# Patient Record
Sex: Female | Born: 1980
Health system: Southern US, Community
[De-identification: ages and names within clinical notes are randomized; demographics above are authoritative.]

## PROBLEM LIST (undated history)

## (undated) ENCOUNTER — Inpatient Hospital Stay (HOSPITAL_COMMUNITY): Payer: 59

## (undated) DIAGNOSIS — Z8619 Personal history of other infectious and parasitic diseases: Secondary | ICD-10-CM

## (undated) DIAGNOSIS — D351 Benign neoplasm of parathyroid gland: Secondary | ICD-10-CM

## (undated) DIAGNOSIS — T8859XA Other complications of anesthesia, initial encounter: Secondary | ICD-10-CM

## (undated) DIAGNOSIS — D219 Benign neoplasm of connective and other soft tissue, unspecified: Secondary | ICD-10-CM

## (undated) DIAGNOSIS — O24419 Gestational diabetes mellitus in pregnancy, unspecified control: Secondary | ICD-10-CM

## (undated) DIAGNOSIS — K219 Gastro-esophageal reflux disease without esophagitis: Secondary | ICD-10-CM

## (undated) DIAGNOSIS — T4145XA Adverse effect of unspecified anesthetic, initial encounter: Secondary | ICD-10-CM

## (undated) HISTORY — DX: Benign neoplasm of connective and other soft tissue, unspecified: D21.9

## (undated) HISTORY — DX: Gastro-esophageal reflux disease without esophagitis: K21.9

## (undated) HISTORY — DX: Benign neoplasm of parathyroid gland: D35.1

## (undated) HISTORY — DX: Personal history of other infectious and parasitic diseases: Z86.19

## (undated) HISTORY — PX: DILATION AND CURETTAGE OF UTERUS: SHX78

## (undated) HISTORY — PX: WISDOM TOOTH EXTRACTION: SHX21

---

## 2008-09-02 HISTORY — PX: DILATION AND CURETTAGE OF UTERUS: SHX78

## 2008-09-09 ENCOUNTER — Encounter (HOSPITAL_COMMUNITY): Payer: Self-pay | Admitting: Obstetrics and Gynecology

## 2008-09-09 ENCOUNTER — Ambulatory Visit (HOSPITAL_COMMUNITY): Admission: RE | Admit: 2008-09-09 | Discharge: 2008-09-09 | Payer: Self-pay | Admitting: Obstetrics and Gynecology

## 2010-01-24 ENCOUNTER — Encounter: Admission: RE | Admit: 2010-01-24 | Discharge: 2010-01-24 | Payer: Self-pay | Admitting: Obstetrics and Gynecology

## 2010-09-13 LAB — CBC
HCT: 41.3 % (ref 36.0–46.0)
MCHC: 34 g/dL (ref 30.0–36.0)
MCV: 95 fL (ref 78.0–100.0)
Platelets: 198 10*3/uL (ref 150–400)
RBC: 4.35 MIL/uL (ref 3.87–5.11)
RDW: 12.5 % (ref 11.5–15.5)

## 2010-09-13 LAB — ABO/RH: ABO/RH(D): B POS

## 2010-09-26 LAB — GC/CHLAMYDIA PROBE AMP, GENITAL
Chlamydia: NEGATIVE
Gonorrhea: NEGATIVE

## 2010-09-26 LAB — RPR: RPR: NONREACTIVE

## 2010-09-26 LAB — HIV ANTIBODY (ROUTINE TESTING W REFLEX): HIV: NONREACTIVE

## 2010-10-02 DIAGNOSIS — O479 False labor, unspecified: Secondary | ICD-10-CM

## 2010-10-17 ENCOUNTER — Other Ambulatory Visit: Payer: Self-pay | Admitting: Neurosurgery

## 2010-10-17 DIAGNOSIS — D497 Neoplasm of unspecified behavior of endocrine glands and other parts of nervous system: Secondary | ICD-10-CM

## 2010-10-17 NOTE — Op Note (Signed)
NAMETENELLE, ANDREASON                ACCOUNT NO.:  [id number]   MEDICAL RECORD NO.:  [id number]          PATIENT TYPE:  AMB   LOCATION:  SDC                           FACILITY:  [facility]   PHYSICIAN:  Zelphia Cairo, MD    DATE OF BIRTH:  1980-12-21   DATE OF PROCEDURE:  09/09/2008  DATE OF DISCHARGE:                               OPERATIVE REPORT   PREOPERATIVE DIAGNOSIS:  Anembryonic  pregnancy.   POSTOPERATIVE DIAGNOSIS:  Anembryonic  pregnancy.   PROCEDURES:  1. Cervical block.  2. D and E.   SURGEON:  Zelphia Cairo, MD   ANESTHESIA:  MAC with local.   SPECIMEN:  Products of conception to Pathology.   ESTIMATED BLOOD LOSS:  Minimal.   COMPLICATIONS:  None.   CONDITION:  Stable to recovery room.   PROCEDURE:  Aleesa was taken to the operating room where anesthesia was  found to be adequate.  She was prepped and draped in sterile fashion and  a catheter was used to drain her bladder for approximately 100 mL of  clear urine.  Bivalve speculum was placed in the uterus and a single-  tooth tenaculum on the anterior lip of the cervix.  The cervix was  serially dilated using Pratt dilators.  A 7-French suction catheter was  inserted into the uterine cavity and products of conception were  removed.  When no further tissue was removed, a gentle curetting was  then performed to ensure an uterine cry throughout the uterine cavity.  Suction curette was then inserted one last time to remove any debris.  Single-tooth tenaculum was removed from the cervix.  Cervix was found to  be hemostatic.  Speculum was removed.  The patient was taken to the  recovery room in stable condition.      Zelphia Cairo, MD  Electronically Signed     GA/MEDQ  D:  09/09/2008  T:  09/09/2008  Job:  [id number]

## 2010-11-14 ENCOUNTER — Other Ambulatory Visit: Payer: Self-pay

## 2010-11-29 ENCOUNTER — Ambulatory Visit
Admission: RE | Admit: 2010-11-29 | Discharge: 2010-11-29 | Disposition: A | Discharge status: Home / Self Care | Payer: 59 | Source: Ambulatory Visit | Attending: Neurosurgery | Admitting: Neurosurgery

## 2010-11-29 DIAGNOSIS — D497 Neoplasm of unspecified behavior of endocrine glands and other parts of nervous system: Secondary | ICD-10-CM

## 2011-02-28 ENCOUNTER — Encounter: Payer: 59 | Attending: Obstetrics and Gynecology | Admitting: Dietician

## 2011-02-28 ENCOUNTER — Encounter: Payer: Self-pay | Admitting: Dietician

## 2011-02-28 DIAGNOSIS — Z713 Dietary counseling and surveillance: Secondary | ICD-10-CM | POA: Insufficient documentation

## 2011-02-28 DIAGNOSIS — O99810 Abnormal glucose complicating pregnancy: Secondary | ICD-10-CM | POA: Insufficient documentation

## 2011-02-28 NOTE — Progress Notes (Signed)
  Patient was seen on 02/28/2011 for Gestational Diabetes self-management class at the [facility]. The following learning objectives were met by the patient during this course:   States the definition of Gestational Diabetes  States why dietary management is important in controlling blood glucose  Describes the effects each nutrient has on blood glucose levels  Demonstrates ability to create a balanced meal plan  Demonstrates carbohydrate counting   States when to check blood glucose levels  Demonstrates proper blood glucose monitoring techniques  States the effect of stress and exercise on blood glucose levels  States the importance of limiting caffeine and abstaining from alcohol and smoking  Blood glucose monitor given: Accu-Chek Nano  Lot # [id number] Exp: 06/03/2012 Blood glucose reading: 128 Had a late lunch with a bag of chips.  Patient instructed to monitor glucose levels: FBS: 60 - <90 1 hour: <140 2 hour: <120  *Patient received handouts:  Nutrition Diabetes and Pregnancy  Carbohydrate Counting List  Patient will be seen for follow-up as needed.

## 2011-03-23 ENCOUNTER — Inpatient Hospital Stay (HOSPITAL_COMMUNITY)
Admission: AD | Admit: 2011-03-23 | Discharge: 2011-03-23 | Disposition: A | Discharge status: Home / Self Care | Payer: 59 | Source: Ambulatory Visit | Attending: Obstetrics and Gynecology | Admitting: Obstetrics and Gynecology

## 2011-03-23 ENCOUNTER — Encounter (HOSPITAL_COMMUNITY): Payer: Self-pay | Admitting: *Deleted

## 2011-03-23 DIAGNOSIS — O99891 Other specified diseases and conditions complicating pregnancy: Secondary | ICD-10-CM | POA: Insufficient documentation

## 2011-03-23 DIAGNOSIS — O479 False labor, unspecified: Secondary | ICD-10-CM

## 2011-03-23 HISTORY — DX: Gestational diabetes mellitus in pregnancy, unspecified control: O24.419

## 2011-03-23 NOTE — ED Notes (Signed)
Discussed use of heating pad, option of microwave pad, only stays warm 15-72min. No threat of injury if she falls asleep on it.

## 2011-03-23 NOTE — ED Provider Notes (Signed)
Nicole Sensor Moore30 y.o.G3P0020 @[redacted]w[redacted]d   SUBJECTIVE  HPI:  She presents with the concern that she woke up to find that her heating pad was on her abdomen. She went to sleep with a heating pad on her back to the low back discomfort. She called the office and was told to come in to make sure the baby is okay. She is unaware of contractions. She has no abdominal pain. No dysuria. No leakage of fluid or vaginal bleeding. The fetus is very active.  Pregnancy is complicated by A1 gestational diabetes and iron deficiency anemia. Her last prenatal visit was yesterday.  Past Medical History  Diagnosis Date  . Gestational diabetes    Past Surgical History  Procedure Date  . Dilation and curettage of uterus    History   Social History  . Marital Status: Married    Spouse Name: N/A    Number of Children: N/A  . Years of Education: N/A   Occupational History  . Not on file.   Social History Main Topics  . Smoking status: Never Smoker   . Smokeless tobacco: Never Used  . Alcohol Use: No  . Drug Use: No  . Sexually Active: Not Currently   Other Topics Concern  . Not on file   Social History Narrative  . No narrative on file   No current facility-administered medications on file prior to encounter.   No current outpatient prescriptions on file prior to encounter.   No Known Allergies  ROS: Pertinent items in HPI  OBJECTIVE  BP 109/65  Pulse 87  Temp(Src) 98.7 F (37.1 C) (Oral)  Resp 20  Ht 5\' 4"  (1.626 m)  Wt 80.91 kg (178 lb 6 oz)  BMI 30.62 kg/m2   Physical Exam:  General: WN/WD in NAD Abd: Soft nontender; consistent with 34 week size. Mildly palpable occasional contraction. VE: cervix posterior, sof,t fingertip/ long/ -2 vertex Back: negative CVAT EAV:WUJWJ dependent edema  Toco: Irregular mild uterine contractions Fetal heart rate: 130 baseline, reactive, moderate variability   ASSESSMENT  Fetal well-being by EFM and good FM   PLAN Reassured pt and notified  Dr. Rana Snare. Continue routine [facility]

## 2011-03-23 NOTE — Progress Notes (Signed)
Patient is here because she wants to make sure that her baby is fine. She states that she was back pain last night and placed a heating pad on a body pillow. Sometime during the night she realized that she was laying on the heating pad. She states that she think that the heating pad was on her abdomen for an hour. She reports good fetal movement. Denies any contractions, vaginal bleeding, lof or discharge. She states that she couldn't go back to sleep and  Her doctor's triage nurse asked to come in.

## 2011-03-23 NOTE — Progress Notes (Signed)
SAYS LAST NIGHT WHEN WENT TO BED  HAD  HEATING PAD ON BACK- SOMETIME DURING NIGHT  SHE TURNED OVER - WITH HEATING PAD ON ABD  X1 HR.   SHE DOES FEEL BABY MOVE. FHR- 166- IN TRIAGE.       CALLED ON CALL NURSE  - TOLD TO COME IN.

## 2011-03-23 NOTE — Progress Notes (Signed)
SAYS SHE WAS LYING ON R SIDE WITH HEATING PAD ON BODY PILLOW.

## 2011-04-25 ENCOUNTER — Encounter (HOSPITAL_COMMUNITY): Payer: Self-pay | Admitting: *Deleted

## 2011-04-25 ENCOUNTER — Telehealth (HOSPITAL_COMMUNITY): Payer: Self-pay | Admitting: *Deleted

## 2011-04-25 NOTE — Telephone Encounter (Signed)
Preadmission screen  

## 2011-05-01 ENCOUNTER — Encounter (HOSPITAL_COMMUNITY): Payer: Self-pay

## 2011-05-01 ENCOUNTER — Encounter (HOSPITAL_COMMUNITY): Payer: Self-pay | Admitting: Anesthesiology

## 2011-05-01 ENCOUNTER — Inpatient Hospital Stay (HOSPITAL_COMMUNITY)
Admission: RE | Admit: 2011-05-01 | Discharge: 2011-05-03 | DRG: 766 | Disposition: A | Discharge status: Home / Self Care | Payer: 59 | Source: Ambulatory Visit | Attending: Obstetrics and Gynecology | Admitting: Obstetrics and Gynecology

## 2011-05-01 ENCOUNTER — Inpatient Hospital Stay (HOSPITAL_COMMUNITY): Payer: 59 | Admitting: Anesthesiology

## 2011-05-01 ENCOUNTER — Encounter (HOSPITAL_COMMUNITY)
Admission: RE | Disposition: A | Discharge status: Home / Self Care | Payer: Self-pay | Source: Ambulatory Visit | Attending: Obstetrics and Gynecology

## 2011-05-01 ENCOUNTER — Other Ambulatory Visit: Payer: Self-pay | Admitting: Obstetrics and Gynecology

## 2011-05-01 DIAGNOSIS — O339 Maternal care for disproportion, unspecified: Secondary | ICD-10-CM | POA: Diagnosis present

## 2011-05-01 DIAGNOSIS — O3660X0 Maternal care for excessive fetal growth, unspecified trimester, not applicable or unspecified: Principal | ICD-10-CM | POA: Diagnosis present

## 2011-05-01 DIAGNOSIS — O99814 Abnormal glucose complicating childbirth: Secondary | ICD-10-CM | POA: Diagnosis present

## 2011-05-01 DIAGNOSIS — O324XX0 Maternal care for high head at term, not applicable or unspecified: Secondary | ICD-10-CM | POA: Diagnosis present

## 2011-05-01 LAB — CBC
Hemoglobin: 12.2 g/dL (ref 12.0–15.0)
MCHC: 32.5 g/dL (ref 30.0–36.0)
Platelets: 194 10*3/uL (ref 150–400)
RBC: 4.22 MIL/uL (ref 3.87–5.11)

## 2011-05-01 LAB — GLUCOSE, CAPILLARY: Glucose-Capillary: 86 mg/dL (ref 70–99)

## 2011-05-01 LAB — RPR: RPR Ser Ql: NONREACTIVE

## 2011-05-01 SURGERY — Surgical Case
Anesthesia: Regional | Site: Abdomen | Wound class: Clean Contaminated

## 2011-05-01 MED ORDER — ZOLPIDEM TARTRATE 10 MG PO TABS: 10.0000 mg | ORAL_TABLET | Freq: Every evening | ORAL | Status: DC | PRN

## 2011-05-01 MED ORDER — OXYTOCIN 20 UNITS IN LACTATED RINGERS INFUSION - SIMPLE: 125.0000 mL/h | INTRAVENOUS | Status: DC

## 2011-05-01 MED ORDER — CEFAZOLIN SODIUM 1-5 GM-% IV SOLN
1.0000 g | Freq: Once | INTRAVENOUS | Status: AC
  Administered 2011-05-01: 1 g via INTRAVENOUS
  Filled 2011-05-01: qty 50

## 2011-05-01 MED ORDER — FLEET ENEMA 7-19 GM/118ML RE ENEM: 1.0000 | ENEMA | RECTAL | Status: DC | PRN

## 2011-05-01 MED ORDER — KETOROLAC TROMETHAMINE 30 MG/ML IJ SOLN: 30.0000 mg | Freq: Four times a day (QID) | INTRAMUSCULAR | Status: AC | PRN

## 2011-05-01 MED ORDER — SCOPOLAMINE 1 MG/3DAYS TD PT72
MEDICATED_PATCH | TRANSDERMAL | Status: AC
  Administered 2011-05-01: 1.5 mg via TRANSDERMAL
  Filled 2011-05-01: qty 1

## 2011-05-01 MED ORDER — SODIUM CHLORIDE 0.9 % IV SOLN: 1.0000 ug/kg/h | INTRAVENOUS | Status: DC | PRN

## 2011-05-01 MED ORDER — IBUPROFEN 600 MG PO TABS
600.0000 mg | ORAL_TABLET | Freq: Four times a day (QID) | ORAL | Status: DC
  Administered 2011-05-02 – 2011-05-03 (×6): 600 mg via ORAL
  Filled 2011-05-01 (×6): qty 1

## 2011-05-01 MED ORDER — SODIUM CHLORIDE 0.9 % IR SOLN
Status: DC | PRN
  Administered 2011-05-01: 1000 mL

## 2011-05-01 MED ORDER — ONDANSETRON HCL 4 MG/2ML IJ SOLN: 4.0000 mg | Freq: Three times a day (TID) | INTRAMUSCULAR | Status: DC | PRN

## 2011-05-01 MED ORDER — OXYTOCIN 20 UNITS IN LACTATED RINGERS INFUSION - SIMPLE
INTRAVENOUS | Status: DC | PRN
  Administered 2011-05-01 (×2): 20 [IU] via INTRAVENOUS

## 2011-05-01 MED ORDER — SODIUM CHLORIDE 0.9 % IJ SOLN: 3.0000 mL | INTRAMUSCULAR | Status: DC | PRN

## 2011-05-01 MED ORDER — SIMETHICONE 80 MG PO CHEW: 80.0000 mg | CHEWABLE_TABLET | ORAL | Status: DC | PRN

## 2011-05-01 MED ORDER — ONDANSETRON HCL 4 MG/2ML IJ SOLN
INTRAMUSCULAR | Status: DC | PRN
  Administered 2011-05-01: 4 mg via INTRAVENOUS

## 2011-05-01 MED ORDER — FENTANYL 2.5 MCG/ML BUPIVACAINE 1/10 % EPIDURAL INFUSION (WH - ANES)
14.0000 mL/h | INTRAMUSCULAR | Status: DC
  Filled 2011-05-01: qty 60

## 2011-05-01 MED ORDER — IBUPROFEN 600 MG PO TABS: 600.0000 mg | ORAL_TABLET | Freq: Four times a day (QID) | ORAL | Status: DC | PRN

## 2011-05-01 MED ORDER — HYDROMORPHONE HCL PF 1 MG/ML IJ SOLN: 0.2500 mg | INTRAMUSCULAR | Status: DC | PRN

## 2011-05-01 MED ORDER — LIDOCAINE-EPINEPHRINE (PF) 2 %-1:200000 IJ SOLN
INTRAMUSCULAR | Status: AC
  Filled 2011-05-01: qty 20

## 2011-05-01 MED ORDER — EPHEDRINE SULFATE 50 MG/ML IJ SOLN
INTRAMUSCULAR | Status: AC
  Filled 2011-05-01: qty 1

## 2011-05-01 MED ORDER — SENNOSIDES-DOCUSATE SODIUM 8.6-50 MG PO TABS
2.0000 | ORAL_TABLET | Freq: Every day | ORAL | Status: DC
  Administered 2011-05-02 (×2): 2 via ORAL

## 2011-05-01 MED ORDER — TETANUS-DIPHTH-ACELL PERTUSSIS 5-2.5-18.5 LF-MCG/0.5 IM SUSP: 0.5000 mL | Freq: Once | INTRAMUSCULAR | Status: DC

## 2011-05-01 MED ORDER — SIMETHICONE 80 MG PO CHEW
80.0000 mg | CHEWABLE_TABLET | Freq: Three times a day (TID) | ORAL | Status: DC
  Administered 2011-05-02 – 2011-05-03 (×7): 80 mg via ORAL

## 2011-05-01 MED ORDER — KETOROLAC TROMETHAMINE 60 MG/2ML IM SOLN
60.0000 mg | Freq: Once | INTRAMUSCULAR | Status: AC | PRN
  Administered 2011-05-01: 60 mg via INTRAMUSCULAR

## 2011-05-01 MED ORDER — DIPHENHYDRAMINE HCL 25 MG PO CAPS: 25.0000 mg | ORAL_CAPSULE | ORAL | Status: DC | PRN

## 2011-05-01 MED ORDER — EPHEDRINE 5 MG/ML INJ: 10.0000 mg | INTRAVENOUS | Status: DC | PRN

## 2011-05-01 MED ORDER — TERBUTALINE SULFATE 1 MG/ML IJ SOLN: 0.2500 mg | Freq: Once | INTRAMUSCULAR | Status: DC | PRN

## 2011-05-01 MED ORDER — PHENYLEPHRINE 40 MCG/ML (10ML) SYRINGE FOR IV PUSH (FOR BLOOD PRESSURE SUPPORT): 80.0000 ug | PREFILLED_SYRINGE | INTRAVENOUS | Status: DC | PRN

## 2011-05-01 MED ORDER — DIPHENHYDRAMINE HCL 50 MG/ML IJ SOLN: 12.5000 mg | INTRAMUSCULAR | Status: DC | PRN

## 2011-05-01 MED ORDER — EPHEDRINE SULFATE 50 MG/ML IJ SOLN
INTRAMUSCULAR | Status: DC | PRN
  Administered 2011-05-01: 10 mg via INTRAVENOUS

## 2011-05-01 MED ORDER — OXYTOCIN BOLUS FROM INFUSION
500.0000 mL | Freq: Once | INTRAVENOUS | Status: DC
  Filled 2011-05-01: qty 500

## 2011-05-01 MED ORDER — OXYCODONE-ACETAMINOPHEN 5-325 MG PO TABS: 2.0000 | ORAL_TABLET | ORAL | Status: DC | PRN

## 2011-05-01 MED ORDER — DEXTROSE IN LACTATED RINGERS 5 % IV SOLN: INTRAVENOUS | Status: DC

## 2011-05-01 MED ORDER — MEASLES, MUMPS & RUBELLA VAC ~~LOC~~ INJ: 0.5000 mL | INJECTION | Freq: Once | SUBCUTANEOUS | Status: DC

## 2011-05-01 MED ORDER — LACTATED RINGERS IV SOLN
INTRAVENOUS | Status: DC | PRN
  Administered 2011-05-01 (×3): via INTRAVENOUS

## 2011-05-01 MED ORDER — OXYTOCIN 10 UNIT/ML IJ SOLN
INTRAMUSCULAR | Status: AC
  Filled 2011-05-01: qty 4

## 2011-05-01 MED ORDER — ONDANSETRON HCL 4 MG/2ML IJ SOLN: 4.0000 mg | INTRAMUSCULAR | Status: DC | PRN

## 2011-05-01 MED ORDER — LACTATED RINGERS IV SOLN
INTRAVENOUS | Status: DC
Start: 2011-05-01 — End: 2011-05-01
  Administered 2011-05-01: 125 mL/h via INTRAVENOUS

## 2011-05-01 MED ORDER — MENTHOL 3 MG MT LOZG: 1.0000 | LOZENGE | OROMUCOSAL | Status: DC | PRN

## 2011-05-01 MED ORDER — LANOLIN HYDROUS EX OINT: 1.0000 "application " | TOPICAL_OINTMENT | CUTANEOUS | Status: DC | PRN

## 2011-05-01 MED ORDER — OXYCODONE-ACETAMINOPHEN 5-325 MG PO TABS
1.0000 | ORAL_TABLET | ORAL | Status: DC | PRN
  Administered 2011-05-02 – 2011-05-03 (×3): 1 via ORAL
  Filled 2011-05-01 (×3): qty 1

## 2011-05-01 MED ORDER — DIPHENHYDRAMINE HCL 50 MG/ML IJ SOLN: 25.0000 mg | INTRAMUSCULAR | Status: DC | PRN

## 2011-05-01 MED ORDER — ONDANSETRON HCL 4 MG/2ML IJ SOLN: 4.0000 mg | Freq: Four times a day (QID) | INTRAMUSCULAR | Status: DC | PRN

## 2011-05-01 MED ORDER — ONDANSETRON HCL 4 MG/2ML IJ SOLN
INTRAMUSCULAR | Status: AC
  Filled 2011-05-01: qty 2

## 2011-05-01 MED ORDER — SCOPOLAMINE 1 MG/3DAYS TD PT72
1.0000 | MEDICATED_PATCH | Freq: Once | TRANSDERMAL | Status: DC
  Administered 2011-05-01: 1.5 mg via TRANSDERMAL

## 2011-05-01 MED ORDER — NALBUPHINE HCL 10 MG/ML IJ SOLN: 5.0000 mg | INTRAMUSCULAR | Status: DC | PRN

## 2011-05-01 MED ORDER — LIDOCAINE HCL 1.5 % IJ SOLN
INTRAMUSCULAR | Status: DC | PRN
  Administered 2011-05-01 (×2): 5 mL via EPIDURAL

## 2011-05-01 MED ORDER — WITCH HAZEL-GLYCERIN EX PADS: 1.0000 "application " | MEDICATED_PAD | CUTANEOUS | Status: DC | PRN

## 2011-05-01 MED ORDER — ONDANSETRON HCL 4 MG PO TABS: 4.0000 mg | ORAL_TABLET | ORAL | Status: DC | PRN

## 2011-05-01 MED ORDER — SODIUM BICARBONATE 8.4 % IV SOLN
INTRAVENOUS | Status: AC
  Filled 2011-05-01: qty 50

## 2011-05-01 MED ORDER — BUTORPHANOL TARTRATE 2 MG/ML IJ SOLN: 1.0000 mg | INTRAMUSCULAR | Status: DC | PRN

## 2011-05-01 MED ORDER — MEDROXYPROGESTERONE ACETATE 150 MG/ML IM SUSP: 150.0000 mg | INTRAMUSCULAR | Status: DC | PRN

## 2011-05-01 MED ORDER — PHENYLEPHRINE 40 MCG/ML (10ML) SYRINGE FOR IV PUSH (FOR BLOOD PRESSURE SUPPORT)
80.0000 ug | PREFILLED_SYRINGE | INTRAVENOUS | Status: DC | PRN
  Filled 2011-05-01: qty 5

## 2011-05-01 MED ORDER — MEPERIDINE HCL 25 MG/ML IJ SOLN
INTRAMUSCULAR | Status: AC
  Filled 2011-05-01: qty 1

## 2011-05-01 MED ORDER — METOCLOPRAMIDE HCL 5 MG/ML IJ SOLN: 10.0000 mg | Freq: Three times a day (TID) | INTRAMUSCULAR | Status: DC | PRN

## 2011-05-01 MED ORDER — NALOXONE HCL 0.4 MG/ML IJ SOLN: 0.4000 mg | INTRAMUSCULAR | Status: DC | PRN

## 2011-05-01 MED ORDER — MORPHINE SULFATE (PF) 0.5 MG/ML IJ SOLN
INTRAMUSCULAR | Status: DC | PRN
  Administered 2011-05-01: 1 mg via INTRAVENOUS

## 2011-05-01 MED ORDER — FENTANYL 2.5 MCG/ML BUPIVACAINE 1/10 % EPIDURAL INFUSION (WH - ANES)
INTRAMUSCULAR | Status: DC | PRN
  Administered 2011-05-01: 14 mL/h via EPIDURAL

## 2011-05-01 MED ORDER — KETOROLAC TROMETHAMINE 60 MG/2ML IM SOLN
INTRAMUSCULAR | Status: AC
  Administered 2011-05-01: 60 mg via INTRAMUSCULAR
  Filled 2011-05-01: qty 2

## 2011-05-01 MED ORDER — OXYTOCIN 20 UNITS IN LACTATED RINGERS INFUSION - SIMPLE: 125.0000 mL/h | Freq: Once | INTRAVENOUS | Status: DC

## 2011-05-01 MED ORDER — MEPERIDINE HCL 25 MG/ML IJ SOLN
INTRAMUSCULAR | Status: DC | PRN
  Administered 2011-05-01 (×3): 6 mg via INTRAVENOUS
  Administered 2011-05-01: 7 mg via INTRAVENOUS

## 2011-05-01 MED ORDER — LACTATED RINGERS IV SOLN
500.0000 mL | INTRAVENOUS | Status: DC | PRN
  Administered 2011-05-01: 1000 mL via INTRAVENOUS

## 2011-05-01 MED ORDER — MEPERIDINE HCL 25 MG/ML IJ SOLN: 6.2500 mg | INTRAMUSCULAR | Status: DC | PRN

## 2011-05-01 MED ORDER — DIPHENHYDRAMINE HCL 25 MG PO CAPS: 25.0000 mg | ORAL_CAPSULE | Freq: Four times a day (QID) | ORAL | Status: DC | PRN

## 2011-05-01 MED ORDER — ACETAMINOPHEN 325 MG PO TABS: 650.0000 mg | ORAL_TABLET | ORAL | Status: DC | PRN

## 2011-05-01 MED ORDER — LIDOCAINE HCL (PF) 1 % IJ SOLN: 30.0000 mL | INTRAMUSCULAR | Status: DC | PRN

## 2011-05-01 MED ORDER — CITRIC ACID-SODIUM CITRATE 334-500 MG/5ML PO SOLN
30.0000 mL | ORAL | Status: DC | PRN
  Administered 2011-05-01: 30 mL via ORAL
  Filled 2011-05-01: qty 15

## 2011-05-01 MED ORDER — LACTATED RINGERS IV SOLN: 500.0000 mL | Freq: Once | INTRAVENOUS | Status: DC

## 2011-05-01 MED ORDER — MORPHINE SULFATE 0.5 MG/ML IJ SOLN
INTRAMUSCULAR | Status: AC
  Filled 2011-05-01: qty 10

## 2011-05-01 MED ORDER — DIBUCAINE 1 % RE OINT: 1.0000 "application " | TOPICAL_OINTMENT | RECTAL | Status: DC | PRN

## 2011-05-01 MED ORDER — MORPHINE SULFATE (PF) 0.5 MG/ML IJ SOLN
INTRAMUSCULAR | Status: DC | PRN
  Administered 2011-05-01: 4 mg via EPIDURAL

## 2011-05-01 MED ORDER — SODIUM BICARBONATE 8.4 % IV SOLN
INTRAVENOUS | Status: DC | PRN
  Administered 2011-05-01: 5 mL via EPIDURAL

## 2011-05-01 MED ORDER — EPHEDRINE 5 MG/ML INJ
10.0000 mg | INTRAVENOUS | Status: DC | PRN
  Filled 2011-05-01: qty 4

## 2011-05-01 SURGICAL SUPPLY — 26 items
CLOTH BEACON ORANGE TIMEOUT ST (SAFETY) ×2 IMPLANT
DRESSING TELFA 8X3 (GAUZE/BANDAGES/DRESSINGS) IMPLANT
DRSG COVADERM 4X10 (GAUZE/BANDAGES/DRESSINGS) ×2 IMPLANT
DRSG PAD ABDOMINAL 8X10 ST (GAUZE/BANDAGES/DRESSINGS) ×2 IMPLANT
ELECT REM PT RETURN 9FT ADLT (ELECTROSURGICAL) ×2
ELECTRODE REM PT RTRN 9FT ADLT (ELECTROSURGICAL) ×1 IMPLANT
EXTRACTOR VACUUM M CUP 4 TUBE (SUCTIONS) IMPLANT
GAUZE SPONGE 4X4 12PLY STRL LF (GAUZE/BANDAGES/DRESSINGS) IMPLANT
GLOVE BIO SURGEON STRL SZ7 (GLOVE) ×4 IMPLANT
GOWN PREVENTION PLUS LG XLONG (DISPOSABLE) ×6 IMPLANT
KIT ABG SYR 3ML LUER SLIP (SYRINGE) ×2 IMPLANT
NEEDLE HYPO 25X5/8 SAFETYGLIDE (NEEDLE) ×2 IMPLANT
NS IRRIG 1000ML POUR BTL (IV SOLUTION) ×2 IMPLANT
PACK C SECTION WH (CUSTOM PROCEDURE TRAY) ×2 IMPLANT
PAD ABD 7.5X8 STRL (GAUZE/BANDAGES/DRESSINGS) ×2 IMPLANT
SLEEVE SCD COMPRESS KNEE LRG (MISCELLANEOUS) ×2 IMPLANT
SLEEVE SCD COMPRESS KNEE MED (MISCELLANEOUS) IMPLANT
SUT CHROMIC 0 CTX 36 (SUTURE) ×6 IMPLANT
SUT MON AB 4-0 PS1 27 (SUTURE) ×2 IMPLANT
SUT PDS AB 0 CT1 27 (SUTURE) ×4 IMPLANT
SUT VIC AB 3-0 CT1 27 (SUTURE) ×2
SUT VIC AB 3-0 CT1 TAPERPNT 27 (SUTURE) ×2 IMPLANT
TAPE CLOTH SURG 4X10 WHT LF (GAUZE/BANDAGES/DRESSINGS) ×2 IMPLANT
TOWEL OR 17X24 6PK STRL BLUE (TOWEL DISPOSABLE) ×4 IMPLANT
TRAY FOLEY CATH 14FR (SET/KITS/TRAYS/PACK) IMPLANT
WATER STERILE IRR 1000ML POUR (IV SOLUTION) ×2 IMPLANT

## 2011-05-01 NOTE — Transfer of Care (Signed)
Immediate Anesthesia Transfer of Care Note  Patient: Nicole Clark  Procedure(s) Performed:  CESAREAN SECTION - Primary cesarean section with delivery of baby boy at 86. Apgars 9/9.  Patient Location: PACU  Anesthesia Type: Epidural  Level of Consciousness: awake, alert , oriented and patient cooperative  Airway & Oxygen Therapy: Patient Spontanous Breathing  Post-op Assessment: Report given to PACU RN and Post -op Vital signs reviewed and stable  Post vital signs: Reviewed and stable  Complications: No apparent anesthesia complications

## 2011-05-01 NOTE — Anesthesia Preprocedure Evaluation (Signed)
Anesthesia Evaluation Anesthesia Physical Anesthesia Plan  ASA: II  Anesthesia Plan: Epidural   Post-op Pain Management:    Induction:   Airway Management Planned:   Additional Equipment:   Intra-op Plan:   Post-operative Plan:   Informed Consent:   Plan Discussed with:   Anesthesia Plan Comments:         Anesthesia Quick Evaluation  

## 2011-05-01 NOTE — Progress Notes (Signed)
No change in descent X 3 hrs, now C/C/LOA with ^ caput at +1.  Discussed CPD and rec. CS, proced + risks reviewed.

## 2011-05-01 NOTE — Progress Notes (Signed)
Now C/C/ LOA at +1, stable FHR, just started pushing, no epidural

## 2011-05-01 NOTE — Anesthesia Preprocedure Evaluation (Addendum)
Anesthesia Evaluation  Patient identified by MRN, date of birth, ID band Patient awake    Reviewed: Allergy & Precautions, H&P , NPO status , Patient's Chart, lab work & pertinent test results  Airway Mallampati: II TM Distance: >3 FB Neck ROM: full    Dental No notable dental hx.    Pulmonary neg pulmonary ROS,  clear to auscultation  Pulmonary exam normal       Cardiovascular neg cardio ROS     Neuro/Psych Negative Neurological ROS  Negative Psych ROS   GI/Hepatic negative GI ROS, Neg liver ROS,   Endo/Other  Diabetes mellitus-, Gestational  Renal/GU negative Renal ROS  Genitourinary negative   Musculoskeletal negative musculoskeletal ROS (+)   Abdominal Normal abdominal exam  (+)   Peds negative pediatric ROS (+)  Hematology negative hematology ROS (+)   Anesthesia Other Findings   Reproductive/Obstetrics (+) Pregnancy                           Anesthesia Physical Anesthesia Plan  ASA: II  Anesthesia Plan: Epidural   Post-op Pain Management:    Induction:   Airway Management Planned:   Additional Equipment:   Intra-op Plan:   Post-operative Plan:   Informed Consent: I have reviewed the patients History and Physical, chart, labs and discussed the procedure including the risks, benefits and alternatives for the proposed anesthesia with the patient or authorized representative who has indicated his/her understanding and acceptance.     Plan Discussed with:   Anesthesia Plan Comments:         Anesthesia Quick Evaluation

## 2011-05-01 NOTE — Progress Notes (Signed)
In O.R. 

## 2011-05-01 NOTE — Progress Notes (Signed)
Anesthesia notified pt complete and pushing to cancel epidural

## 2011-05-01 NOTE — Anesthesia Postprocedure Evaluation (Signed)
  Anesthesia Post-op Note  Patient: Nicole Clark  Procedure(s) Performed:  CESAREAN SECTION - Primary cesarean section with delivery of baby boy at 25. Apgars 9/9.   Patient is awake, responsive, moving her legs, and has signs of resolution of her numbness. Pain and nausea are reasonably well controlled. Vital signs are stable and clinically acceptable. Oxygen saturation is clinically acceptable. There are no apparent anesthetic complications at this time. Patient is ready for discharge.

## 2011-05-01 NOTE — Progress Notes (Signed)
To OR

## 2011-05-01 NOTE — Progress Notes (Signed)
Dr. Holland at bedside.

## 2011-05-01 NOTE — H&P (Signed)
Nicole Clark is a 30 y.o. female presenting for induction of labor due to favorable cervix, [redacted] weeks EGA and LGA. History OB History    Grav Para Term Preterm Abortions TAB SAB Ect Mult Living   3    2 1 1    0     Past Medical History  Diagnosis Date  . Gestational diabetes   . Fibroids    Past Surgical History  Procedure Date  . Dilation and curettage of uterus    Family History: family history includes Cancer in her maternal grandmother; Diabetes in her father and mother; Hypertension in her mother; Mental illness in her mother; and Nephrolithiasis in her mother. Social History:  reports that she has never smoked. She has never used smokeless tobacco. She reports that she does not drink alcohol or use illicit drugs.  ROS  Dilation: 3 Effacement (%): 70 Station: -2 Exam by:: Dr Rana Snare Blood pressure 141/72, pulse 96, temperature 98.5 F (36.9 C), temperature source Oral, resp. rate 20, height 5\' 3"  (1.6 m), weight 81.647 kg (180 lb). Exam Physical Exam  Prenatal labs: ABO, Rh:   Antibody: Negative (04/24 0000) Rubella: Immune (04/24 0000) RPR: Nonreactive (04/24 0000)  HBsAg: Negative (04/24 0000)  HIV: Non-reactive (04/24 0000)  GBS: Negative (11/01 0000)   Assessment/Plan: IUP at 39 weeks with favorable cervix AROM and pitocin Anticipate SVD Diet contolled Gest Diabetes LGA baby   Nicole Clark C 05/01/2011, 8:06 AM

## 2011-05-01 NOTE — OR Nursing (Signed)
Uterus massaged by S. Leeroy Lovings Charity fundraiser. Two tubes of cord blood to lab. Foley catheter in place on arrival to OR. Urine color-clear.

## 2011-05-01 NOTE — Progress Notes (Signed)
Pt up to walk in hall until 0930

## 2011-05-01 NOTE — Anesthesia Procedure Notes (Signed)
Epidural Patient location during procedure: OB Start time: 05/01/2011 3:54 PM End time: 05/01/2011 4:00 PM Reason for block: procedure for pain  Staffing Anesthesiologist: Sandrea Hughs Performed by: anesthesiologist   Preanesthetic Checklist Completed: patient identified, site marked, surgical consent, pre-op evaluation, timeout performed, IV checked, risks and benefits discussed and monitors and equipment checked  Epidural Patient position: sitting Prep: site prepped and draped and DuraPrep Patient monitoring: continuous pulse ox and blood pressure Approach: midline Injection technique: LOR air  Needle:  Needle type: Tuohy  Needle gauge: 17 G Needle length: 9 cm Needle insertion depth: 6 cm Catheter type: closed end flexible Catheter size: 19 Gauge Catheter at skin depth: 11 cm Test dose: negative and 1.5% lidocaine  Assessment Sensory level: T8 Events: blood not aspirated, injection not painful, no injection resistance, negative IV test and no paresthesia

## 2011-05-01 NOTE — Progress Notes (Signed)
Called anesthesia requested MD to come for epidural pt not moving infant head with pushing and in very good control

## 2011-05-01 NOTE — Op Note (Signed)
Cesarean Section Procedure Note   CERISE LIEBER  05/01/2011  Indications: cephalopelvic disproportion   Pre-operative Diagnosis: Failure of Descent.   Post-operative Diagnosis: Same   Surgeon: Surgeon(s) and Role:    * Meriel Pica - Primary    * Zelphia Cairo - Assisting   Anesthesia: epidural   Procedure Details:  The patient was seen in the Holding Room. The risks, benefits, complications, treatment options, and expected outcomes were discussed with the patient. The patient concurred with the proposed plan, giving informed consent. identified as Bobbie Stack and the procedure verified as C-Section Delivery. A Time Out was held and the above information confirmed.  After induction of anesthesia, the patient was draped and prepped in the usual sterile manner. A transverse was made and carried down through the subcutaneous tissue to the fascia. Fascial incision was made and extended transversely. The fascia was separated from the underlying rectus tissue superiorly and inferiorly. The peritoneum was identified and entered. Peritoneal incision was extended longitudinally. The utero-vesical peritoneal reflection was incised transversely and the bladder flap was bluntly freed from the lower uterine segment. A low transverse uterine incision was made. Delivered from cephalic presentation was a female infant with Apgar scores of 9 at one minute and 9 at five minutes. Cord ph was sent the umbilical cord was clamped and cut cord blood was obtained for evaluation. The placenta was removed Intact and appeared normal. The uterine outline, tubes and ovaries appeared normal}. The uterine incision was closed with running locked sutures of 0chromic gut.   Hemostasis was observed. Lavage was carried out until clear. The fascia was then reapproximated with running sutures of 0PDS.The skin was closed with subcuticular suture and dermabond.   Instrument, sponge, and needle counts were correct prior the  abdominal closure and were correct at the conclusion of the case.    Estimated Blood Loss: 600cc   Urine Output: clear  Specimens: placenta   Complications: no complications  Disposition: PACU - hemodynamically stable.   Maternal Condition: stable   Baby condition / location:  nursery-stable  Attending Attestation: I was present and scrubbed for the entire procedure.   Signed: Surgeon(s): Richard M Melonie [place]

## 2011-05-02 ENCOUNTER — Encounter (HOSPITAL_COMMUNITY): Payer: Self-pay | Admitting: Obstetrics and Gynecology

## 2011-05-02 LAB — CBC
HCT: 34.2 % — ABNORMAL LOW (ref 36.0–46.0)
MCH: 29.1 pg (ref 26.0–34.0)
MCV: 89.5 fL (ref 78.0–100.0)
RBC: 3.82 MIL/uL — ABNORMAL LOW (ref 3.87–5.11)
WBC: 10.5 10*3/uL (ref 4.0–10.5)

## 2011-05-02 MED ORDER — LACTATED RINGERS IV SOLN
INTRAVENOUS | Status: DC
  Administered 2011-05-02: 02:00:00 via INTRAVENOUS

## 2011-05-02 NOTE — Progress Notes (Signed)
Subjective: Postpartum Day 1: Cesarean Delivery Patient reports incisional pain, tolerating PO and + flatus.    Objective: Vital signs in last 24 hours: Temp:  [97.5 F (36.4 C)-99.4 F (37.4 C)] 99.3 F (37.4 C) (11/28 0700) Pulse Rate:  [77-103] 101  (11/28 0700) Resp:  [16-27] 18  (11/28 0700) BP: (97-155)/(45-86) 124/69 mmHg (11/28 0700) SpO2:  [95 %-98 %] 96 % (11/28 0700)  Physical Exam:  General: alert and cooperative Lochia: appropriate Uterine Fundus: firm Incision: healing well DVT Evaluation: No evidence of DVT seen on physical exam.   Basename 05/02/11 0545 05/01/11 0735  HGB 11.1* 12.2  HCT 34.2* 37.5    Assessment/Plan: Status post Cesarean section. Doing well postoperatively.  Continue current care.  Mireya Meditz G 05/02/2011, 8:18 AM

## 2011-05-02 NOTE — Addendum Note (Signed)
Addendum  created 05/02/11 [id number] by Madison Hickman   Modules edited:Notes Section

## 2011-05-02 NOTE — Progress Notes (Signed)
Urinary Catheter Removed Per Protocol.

## 2011-05-02 NOTE — Anesthesia Postprocedure Evaluation (Signed)
  Anesthesia Post-op Note  Patient: Nicole Clark  Procedure(s) Performed:  CESAREAN SECTION - Primary cesarean section with delivery of baby boy at 25. Apgars 9/9.  Patient Location: [facility]  Anesthesia Type: Epidural  Level of Consciousness: awake, alert  and oriented  Airway and Oxygen Therapy: Patient Spontanous Breathing  Post-op Pain: none  Post-op Assessment: Post-op Vital signs reviewed, Patient's Cardiovascular Status Stable, No headache, No backache, No residual numbness and No residual motor weakness  Post-op Vital Signs: Reviewed and stable  Complications: No apparent anesthesia complications

## 2011-05-03 MED ORDER — IBUPROFEN 600 MG PO TABS: 600.0000 mg | ORAL_TABLET | Freq: Four times a day (QID) | ORAL | Status: AC

## 2011-05-03 MED ORDER — OXYCODONE-ACETAMINOPHEN 5-325 MG PO TABS: 1.0000 | ORAL_TABLET | ORAL | Status: AC | PRN

## 2011-05-03 NOTE — Discharge Summary (Signed)
Obstetric Discharge Summary Reason for Admission: induction of labor Prenatal Procedures: ultrasound Intrapartum Procedures: cesarean: low cervical, transverse Postpartum Procedures: none Complications-Operative and Postpartum: none Hemoglobin  Date Value Range Status  05/02/2011 11.1* 12.0-15.0 (g/dL) Final     HCT  Date Value Range Status  05/02/2011 34.2* 36.0-46.0 (%) Final    Discharge Diagnoses: Term Pregnancy-delivered  Discharge Information: Date: 05/03/2011 Activity: pelvic rest Diet: routine Medications: PNV, Ibuprofen and Percocet Condition: stable Instructions: refer to practice specific booklet Discharge to: home   Newborn Data: Live born female  Birth Weight: 9 lb 4.3 oz (4205 g) APGAR: 9, 9  Home with mother.  Nicole Clark G 05/03/2011, 8:43 AM

## 2011-05-03 NOTE — Progress Notes (Signed)
D/W patient/husband infant circumcision risks/complications

## 2011-05-03 NOTE — Progress Notes (Signed)
Subjective: Postpartum Day 2: Cesarean Delivery Patient reports tolerating PO, + flatus and no problems voiding.  Desires baby circ  Objective: Vital signs in last 24 hours: Temp:  [97.9 F (36.6 C)-98.6 F (37 C)] 98.1 F (36.7 C) (11/29 0645) Pulse Rate:  [60-98] 82  (11/29 0645) Resp:  [17-19] 18  (11/29 0645) BP: (93-130)/(52-74) 115/74 mmHg (11/29 0645) SpO2:  [97 %-99 %] 99 % (11/28 1945)  Physical Exam:  General: alert and cooperative Lochia: appropriate Uterine Fundus: firm Incision: healing well DVT Evaluation: No evidence of DVT seen on physical exam.   Basename 05/02/11 0545 05/01/11 0735  HGB 11.1* 12.2  HCT 34.2* 37.5    Assessment/Plan: Status post Cesarean section. Doing well postoperatively.  Continue current care.  Satchel Heidinger G 05/03/2011, 7:47 AM

## 2011-05-03 NOTE — Progress Notes (Signed)
Patient ID: Nicole Clark, female   DOB: Dec 17, 1980, 30 y.o.   MRN: [id number]  Patient desires early discharge. Instructions given. Rx for Percocet and Ibupropen given. RTO in 1 week for incision check

## 2011-05-05 NOTE — Anesthesia Procedure Notes (Signed)
Procedures

## 2011-05-05 NOTE — Addendum Note (Signed)
Addendum  created 05/05/11 1356 by Alexiz Sustaita L. Rodman Pickle, MD   Modules edited:Anesthesia Blocks and Procedures, Inpatient Notes

## 2013-12-23 ENCOUNTER — Ambulatory Visit (INDEPENDENT_AMBULATORY_CARE_PROVIDER_SITE_OTHER): Payer: 59 | Admitting: Sports Medicine

## 2013-12-23 VITALS — BP 109/65 | Ht 63.0 in | Wt 152.0 lb

## 2013-12-23 DIAGNOSIS — M76899 Other specified enthesopathies of unspecified lower limb, excluding foot: Secondary | ICD-10-CM

## 2013-12-23 DIAGNOSIS — M6580 Other synovitis and tenosynovitis, unspecified site: Secondary | ICD-10-CM

## 2013-12-23 NOTE — Progress Notes (Signed)
  Nicole Clark - 33 y.o. female MRN [id number]  Date of birth: [birth year]  SUBJECTIVE:  Including CC & ROS.  The patient presents for evaluation of: Right Knee Pain: 4 weeks onset of right anterior superior knee pain began following increased running mileage during 100 mile challenge for the month of June. Worsening onset on July 4 during 5 days with 3-4 days of persistent pain that was nonradiating. No other injuries or incident noted. She has been taking time off but has not been taking any medications or doing physical therapy on a regular basis. She has never had a prior injury.    HISTORY: Past Medical, Surgical, Social, and Family History Reviewed & Updated per EMR. Pertinent Historical Findings include: Otherwise healthy. On birth control pills. Dentist at [employer]  PHYSICAL EXAM:  VS: BP:109/65 mmHg  HR: bpm  TEMP: ( )  RESP:   HT:5\' 3"  (160 cm)   WT:152 lb (68.947 kg)  BMI:27 PHYSICAL EXAM: GENERAL:  Adult African American athletic  female. In no discomfort; no respiratory distress  PSYCH:  alert and appropriate, good insight  Right knee Exam:        Gen/Palp:  Overall normal-appearing with a slight soft tissue prominence/edema over the proximal patellar pole. No joint effusion. There is a small amount of crepitation with direct palpation on the proximal patellar pole.                    ROM:  Full knee extension and flexion                      NV:  Quad strength 5+/ 5. Pain is elicited with straight leg raise. Bilateral hip abduction strength is 5+/5.                 Tests:  Collateral and cruciate ligaments are intact. Negative McMurray's.    negative fulcrum test.  No short-leg. Normal motion with ASIS compression test.  Limited MSK Ultrasound Exam Of Right Knee:           Findings:   Tendinous junction at the proximal patella and quadricep tendon reveals an area of hypoechoic changes and thickening. The tendon measures approximately 0.6 cm  compared to the left  that is less than 0.4 cm that is hypoechoic. There is a small knee effusion on the right knee; no effusion on left knee.      Impression:   Findings consistent with quadriceps tendinitis.   ASSESSMENT & PLAN: See problem based charting & AVS for pt instructions.

## 2013-12-23 NOTE — Assessment & Plan Note (Signed)
Subacute condition  - likely reflective of an overuse injury during a significant increase in her mileage. She did have findings of early quadriceps tendinitis on the left. I suspect the trace effusion is due to a reactive process and does not reflect an intra-articular issue. 1. Mobic x7 days then when necessary; ice when necessary 2. Eccentric quad strengthening exercises with decline squats. 3. She may gradually return to activity over the next 2-3 weeks and is to start with shorter mileage.    > Follow up 4 weeks. Consider re\re ultrasound to measure quad thickness. If persistently painful consider nitroglycerin protocol.

## 2013-12-24 NOTE — Addendum Note (Signed)
Addended by: Lilia Argue R on: 12/24/2013 09:27 AM   Modules accepted: Level of Service

## 2014-01-22 ENCOUNTER — Other Ambulatory Visit: Payer: Self-pay | Admitting: Sports Medicine

## 2014-02-15 ENCOUNTER — Encounter: Payer: Self-pay | Admitting: Sports Medicine

## 2014-02-15 ENCOUNTER — Ambulatory Visit
Admission: RE | Admit: 2014-02-15 | Discharge: 2014-02-15 | Disposition: A | Discharge status: Home / Self Care | Payer: 59 | Source: Ambulatory Visit | Attending: Sports Medicine | Admitting: Sports Medicine

## 2014-02-15 ENCOUNTER — Ambulatory Visit (INDEPENDENT_AMBULATORY_CARE_PROVIDER_SITE_OTHER): Payer: 59 | Admitting: Sports Medicine

## 2014-02-15 VITALS — BP 115/76 | HR 62 | Ht 63.0 in | Wt 152.0 lb

## 2014-02-15 DIAGNOSIS — M25561 Pain in right knee: Secondary | ICD-10-CM

## 2014-02-15 DIAGNOSIS — M25569 Pain in unspecified knee: Secondary | ICD-10-CM

## 2014-02-15 NOTE — Progress Notes (Addendum)
   Subjective:    Patient ID: Nicole Clark, female    DOB: 1980/07/27, 33 y.o.   MRN: [id number]  Knee Pain    Patient comes in today with persistent right knee pain. She was last seen in the office back in July. Ultrasound at that time suggested a thickened quadriceps tendon consistent with tendinitis. She was placed on a home exercise program and prescribed Mobic. Unfortunately, her pain persists. She localizes her pain just proximal to the patella and she has noticed recent swelling with activity. She has been unable to return to running. She will limp at times. No mechanical symptoms. Her knee does get stiff with prolonged sitting.   Review of Systems Per HPI    Objective:   Physical Exam Well-developed, well-nourished. No acute distress. Right knee: Full range of motion. Trace effusion. There is a palpable click underneath the quad tendon with full extension. It is nonpainful. There is no tenderness to palpation directly over the quadriceps tendon. No tenderness to palpation directly over the patella. No prepatellar swelling. No tenderness to palpation along either medial or lateral joint lines. Good stability. Neurovascularly intact distally.    Ultrasound Limited ultrasound of the right knee was performed. Images were obtained and compared to the left knee. Quadriceps tendon still appears to be slightly thickened but is unchanged when compared to the previous scan (0.6 cm in the long axis). A small joint effusion is also seen. No effusion in the left knee.    Assessment & Plan:  Persistent right knee pain-rule out intra-articular pathology versus possible stress fracture  I'm going to start with plain x-rays of the right knee. If unremarkable I think we should pursue an MRI scan given her joint effusion on today's exam and ultrasound. Her symptoms all began after she started a new running program so there is some concern for a possible stress fracture. I will followup with her via  telephone with the MRI results once available at which point we will delineate further treatment.  Addendum: X-rays are unremarkable. Proceed with MRI scan.

## 2014-02-23 ENCOUNTER — Other Ambulatory Visit: Payer: 59

## 2014-02-23 ENCOUNTER — Ambulatory Visit
Admission: RE | Admit: 2014-02-23 | Discharge: 2014-02-23 | Disposition: A | Discharge status: Home / Self Care | Payer: 59 | Source: Ambulatory Visit | Attending: Sports Medicine | Admitting: Sports Medicine

## 2014-02-23 DIAGNOSIS — M25561 Pain in right knee: Secondary | ICD-10-CM

## 2014-02-24 ENCOUNTER — Telehealth: Payer: Self-pay | Admitting: Sports Medicine

## 2014-02-24 NOTE — Telephone Encounter (Signed)
I spoke with the patient on the phone today after reviewing the MRI of her right knee. No internal derangement. Dominant finding is some prepatellar subcutaneous edema as well as a small amount of fluid in Hoffa's fat pad. Very small knee effusion. I previously placed her on meloxicam which was only minimally beneficial. Therefore, I think we should try a 6 day Sterapred Dosepak. I've asked her to call me next week with an update on how she is doing.

## 2014-02-25 ENCOUNTER — Other Ambulatory Visit: Payer: Self-pay | Admitting: *Deleted

## 2014-02-25 MED ORDER — PREDNISONE (PAK) 10 MG PO TABS: ORAL_TABLET | ORAL | Status: DC

## 2014-04-05 ENCOUNTER — Encounter: Payer: Self-pay | Admitting: Sports Medicine

## 2014-04-05 LAB — OB RESULTS CONSOLE GC/CHLAMYDIA
CHLAMYDIA, DNA PROBE: NEGATIVE
Gonorrhea: NEGATIVE

## 2014-04-07 LAB — OB RESULTS CONSOLE HIV ANTIBODY (ROUTINE TESTING): HIV: NONREACTIVE

## 2014-04-07 LAB — OB RESULTS CONSOLE RUBELLA ANTIBODY, IGM: Rubella: IMMUNE

## 2014-04-07 LAB — OB RESULTS CONSOLE RPR: RPR: NONREACTIVE

## 2014-04-07 LAB — OB RESULTS CONSOLE ABO/RH: ABO/RH(D): B POS

## 2014-04-07 LAB — OB RESULTS CONSOLE HEPATITIS B SURFACE ANTIGEN: Hepatitis B Surface Ag: NEGATIVE

## 2014-04-07 LAB — OB RESULTS CONSOLE ANTIBODY SCREEN: Antibody Screen: NEGATIVE

## 2014-06-04 DIAGNOSIS — O24419 Gestational diabetes mellitus in pregnancy, unspecified control: Secondary | ICD-10-CM

## 2014-06-04 HISTORY — DX: Gestational diabetes mellitus in pregnancy, unspecified control: O24.419

## 2014-09-01 ENCOUNTER — Encounter: Payer: 59 | Attending: Obstetrics and Gynecology

## 2014-09-01 VITALS — Wt 175.7 lb

## 2014-09-01 DIAGNOSIS — Z713 Dietary counseling and surveillance: Secondary | ICD-10-CM | POA: Insufficient documentation

## 2014-09-01 DIAGNOSIS — O24419 Gestational diabetes mellitus in pregnancy, unspecified control: Secondary | ICD-10-CM

## 2014-09-01 NOTE — Progress Notes (Signed)
  Patient was seen on 09/01/14 for Gestational Diabetes self-management . The following learning objectives were met by the patient :   States the definition of Gestational Diabetes  States why dietary management is important in controlling blood glucose  Describes the effects of carbohydrates on blood glucose levels  Demonstrates ability to create a balanced meal plan  Demonstrates carbohydrate counting   States when to check blood glucose levels  Demonstrates proper blood glucose monitoring techniques  States the effect of stress and exercise on blood glucose levels  States the importance of limiting caffeine and abstaining from alcohol and smoking  Plan:  Aim for 2 Carb Choices per meal (30 grams) +/- 1 either way for breakfast Aim for 3 Carb Choices per meal (45 grams) +/- 1 either way from lunch and dinner Aim for 1-2 Carbs per snack Begin reading food labels for Total Carbohydrate and sugar grams of foods Consider  increasing your activity level by walking daily as tolerated Begin checking BG before breakfast and 2 hours after first bit of breakfast, lunch and dinner after  as directed by MD  Take medication  as directed by MD  Blood glucose monitor given: Provided by Dr. Garwin Brothers Accu-Chek Nano Blood glucose reading: 2hpp 96  Patient instructed to monitor glucose levels: FBS: 60 - <90 2 hour: <120  Patient received the following handouts:  Nutrition Diabetes and Pregnancy  Carbohydrate Counting List  Meal Planning worksheet  Patient will be seen for follow-up as needed.

## 2014-10-14 ENCOUNTER — Other Ambulatory Visit: Payer: Self-pay | Admitting: Obstetrics and Gynecology

## 2014-10-22 ENCOUNTER — Encounter (HOSPITAL_COMMUNITY): Payer: Self-pay

## 2014-10-25 ENCOUNTER — Encounter (HOSPITAL_COMMUNITY)
Admission: RE | Admit: 2014-10-25 | Discharge: 2014-10-25 | Disposition: A | Discharge status: Home / Self Care | Payer: 59 | Source: Ambulatory Visit | Attending: Obstetrics and Gynecology | Admitting: Obstetrics and Gynecology

## 2014-10-25 ENCOUNTER — Encounter (HOSPITAL_COMMUNITY): Payer: Self-pay

## 2014-10-25 HISTORY — DX: Other complications of anesthesia, initial encounter: T88.59XA

## 2014-10-25 HISTORY — DX: Adverse effect of unspecified anesthetic, initial encounter: T41.45XA

## 2014-10-25 LAB — CBC
HCT: 35.1 % — ABNORMAL LOW (ref 36.0–46.0)
HEMOGLOBIN: 11.8 g/dL — AB (ref 12.0–15.0)
MCH: 31.1 pg (ref 26.0–34.0)
MCHC: 33.6 g/dL (ref 30.0–36.0)
MCV: 92.6 fL (ref 78.0–100.0)
Platelets: 175 10*3/uL (ref 150–400)
RBC: 3.79 MIL/uL — ABNORMAL LOW (ref 3.87–5.11)
RDW: 13.4 % (ref 11.5–15.5)
WBC: 5.1 10*3/uL (ref 4.0–10.5)

## 2014-10-25 MED ORDER — CEFAZOLIN SODIUM-DEXTROSE 2-3 GM-% IV SOLR
2.0000 g | INTRAVENOUS | Status: AC
  Administered 2014-10-26: 2 g via INTRAVENOUS

## 2014-10-25 NOTE — Patient Instructions (Addendum)
Your procedure is scheduled on:10/26/14  Enter through the Main Entrance at : Pick up desk phone and dial [phone] and inform us of your arrival.  Please call [phone] if you have any problems the morning of surgery.  Remember: Do not eat food or drink liquids after midnight:  Water ok until 0330 am Tuesday day of surgery  You may brush your teeth the morning of surgery.   DO NOT wear jewelry, eye make-up, lipstick,body lotion, or dark fingernail polish.  (Polished toes are ok) You may wear deodorant.  If you are to be admitted after surgery, leave suitcase in car until your room has been assigned.  Wear loose fitting, comfortable clothes for your ride home.

## 2014-10-26 ENCOUNTER — Inpatient Hospital Stay (HOSPITAL_COMMUNITY): Payer: 59 | Admitting: Anesthesiology

## 2014-10-26 ENCOUNTER — Inpatient Hospital Stay (HOSPITAL_COMMUNITY)
Admission: AD | Admit: 2014-10-26 | Discharge: 2014-10-28 | DRG: 765 | Disposition: A | Discharge status: Home / Self Care | Payer: 59 | Source: Ambulatory Visit | Attending: Obstetrics and Gynecology | Admitting: Obstetrics and Gynecology

## 2014-10-26 ENCOUNTER — Encounter (HOSPITAL_COMMUNITY): Payer: Self-pay | Admitting: *Deleted

## 2014-10-26 ENCOUNTER — Encounter (HOSPITAL_COMMUNITY)
Admission: AD | Disposition: A | Discharge status: Home / Self Care | Payer: Self-pay | Source: Ambulatory Visit | Attending: Obstetrics and Gynecology

## 2014-10-26 DIAGNOSIS — O3413 Maternal care for benign tumor of corpus uteri, third trimester: Secondary | ICD-10-CM | POA: Diagnosis present

## 2014-10-26 DIAGNOSIS — Z3A39 39 weeks gestation of pregnancy: Secondary | ICD-10-CM | POA: Diagnosis present

## 2014-10-26 DIAGNOSIS — O9081 Anemia of the puerperium: Secondary | ICD-10-CM | POA: Diagnosis present

## 2014-10-26 DIAGNOSIS — O24420 Gestational diabetes mellitus in childbirth, diet controlled: Secondary | ICD-10-CM | POA: Diagnosis present

## 2014-10-26 DIAGNOSIS — O3421 Maternal care for scar from previous cesarean delivery: Secondary | ICD-10-CM | POA: Diagnosis present

## 2014-10-26 DIAGNOSIS — O3663X0 Maternal care for excessive fetal growth, third trimester, not applicable or unspecified: Secondary | ICD-10-CM | POA: Diagnosis present

## 2014-10-26 DIAGNOSIS — Z8249 Family history of ischemic heart disease and other diseases of the circulatory system: Secondary | ICD-10-CM

## 2014-10-26 DIAGNOSIS — Z833 Family history of diabetes mellitus: Secondary | ICD-10-CM | POA: Diagnosis not present

## 2014-10-26 DIAGNOSIS — D62 Acute posthemorrhagic anemia: Secondary | ICD-10-CM | POA: Diagnosis present

## 2014-10-26 DIAGNOSIS — D259 Leiomyoma of uterus, unspecified: Secondary | ICD-10-CM | POA: Diagnosis present

## 2014-10-26 LAB — GLUCOSE, CAPILLARY
GLUCOSE-CAPILLARY: 97 mg/dL (ref 65–99)
Glucose-Capillary: 96 mg/dL (ref 65–99)

## 2014-10-26 LAB — RPR: RPR Ser Ql: NONREACTIVE

## 2014-10-26 LAB — PREPARE RBC (CROSSMATCH)

## 2014-10-26 SURGERY — Surgical Case
Anesthesia: Spinal | Site: Abdomen

## 2014-10-26 MED ORDER — NALBUPHINE HCL 10 MG/ML IJ SOLN: 5.0000 mg | INTRAMUSCULAR | Status: DC | PRN

## 2014-10-26 MED ORDER — LACTATED RINGERS IV SOLN
INTRAVENOUS | Status: DC
  Administered 2014-10-26 (×4): via INTRAVENOUS

## 2014-10-26 MED ORDER — SIMETHICONE 80 MG PO CHEW
80.0000 mg | CHEWABLE_TABLET | Freq: Three times a day (TID) | ORAL | Status: DC
  Administered 2014-10-26 – 2014-10-28 (×5): 80 mg via ORAL
  Filled 2014-10-26 (×5): qty 1

## 2014-10-26 MED ORDER — SCOPOLAMINE 1 MG/3DAYS TD PT72
1.0000 | MEDICATED_PATCH | Freq: Once | TRANSDERMAL | Status: DC
  Administered 2014-10-26: 1.5 mg via TRANSDERMAL

## 2014-10-26 MED ORDER — MEPERIDINE HCL 25 MG/ML IJ SOLN
INTRAMUSCULAR | Status: AC
Start: 2014-10-26 — End: 2014-10-26
  Filled 2014-10-26: qty 1

## 2014-10-26 MED ORDER — METHYLERGONOVINE MALEATE 0.2 MG/ML IJ SOLN: 0.2000 mg | INTRAMUSCULAR | Status: DC | PRN

## 2014-10-26 MED ORDER — DIBUCAINE 1 % RE OINT: 1.0000 "application " | TOPICAL_OINTMENT | RECTAL | Status: DC | PRN

## 2014-10-26 MED ORDER — PRENATAL MULTIVITAMIN CH
1.0000 | ORAL_TABLET | Freq: Every day | ORAL | Status: DC
  Administered 2014-10-27 – 2014-10-28 (×2): 1 via ORAL
  Filled 2014-10-26 (×2): qty 1

## 2014-10-26 MED ORDER — SIMETHICONE 80 MG PO CHEW
80.0000 mg | CHEWABLE_TABLET | ORAL | Status: DC
  Administered 2014-10-26 – 2014-10-27 (×2): 80 mg via ORAL
  Filled 2014-10-26 (×2): qty 1

## 2014-10-26 MED ORDER — ONDANSETRON HCL 4 MG/2ML IJ SOLN
INTRAMUSCULAR | Status: DC | PRN
Start: 2014-10-26 — End: 2014-10-26
  Administered 2014-10-26: 4 mg via INTRAVENOUS

## 2014-10-26 MED ORDER — SODIUM CHLORIDE 0.9 % IJ SOLN: 3.0000 mL | INTRAMUSCULAR | Status: DC | PRN

## 2014-10-26 MED ORDER — NALBUPHINE HCL 10 MG/ML IJ SOLN: 5.0000 mg | Freq: Once | INTRAMUSCULAR | Status: AC | PRN

## 2014-10-26 MED ORDER — LACTATED RINGERS IV SOLN
INTRAVENOUS | Status: DC
  Administered 2014-10-26: 11:00:00 via INTRAVENOUS

## 2014-10-26 MED ORDER — KETOROLAC TROMETHAMINE 30 MG/ML IJ SOLN: 30.0000 mg | Freq: Four times a day (QID) | INTRAMUSCULAR | Status: AC | PRN

## 2014-10-26 MED ORDER — FENTANYL CITRATE (PF) 100 MCG/2ML IJ SOLN
INTRAMUSCULAR | Status: DC | PRN
  Administered 2014-10-26: 25 ug via INTRATHECAL

## 2014-10-26 MED ORDER — ZOLPIDEM TARTRATE 5 MG PO TABS
5.0000 mg | ORAL_TABLET | Freq: Every evening | ORAL | Status: DC | PRN
Start: 2014-10-26 — End: 2014-10-28

## 2014-10-26 MED ORDER — OXYTOCIN 40 UNITS IN LACTATED RINGERS INFUSION - SIMPLE MED: 62.5000 mL/h | INTRAVENOUS | Status: AC

## 2014-10-26 MED ORDER — 0.9 % SODIUM CHLORIDE (POUR BTL) OPTIME
TOPICAL | Status: DC | PRN
  Administered 2014-10-26: 1000 mL

## 2014-10-26 MED ORDER — NALOXONE HCL 1 MG/ML IJ SOLN
1.0000 ug/kg/h | INTRAVENOUS | Status: DC | PRN
  Filled 2014-10-26: qty 2

## 2014-10-26 MED ORDER — ONDANSETRON HCL 4 MG/2ML IJ SOLN: 4.0000 mg | Freq: Three times a day (TID) | INTRAMUSCULAR | Status: DC | PRN

## 2014-10-26 MED ORDER — METHYLERGONOVINE MALEATE 0.2 MG PO TABS: 0.2000 mg | ORAL_TABLET | ORAL | Status: DC | PRN

## 2014-10-26 MED ORDER — MEPERIDINE HCL 25 MG/ML IJ SOLN
INTRAMUSCULAR | Status: DC | PRN
  Administered 2014-10-26: 25 mg via INTRAVENOUS

## 2014-10-26 MED ORDER — CEFAZOLIN SODIUM-DEXTROSE 2-3 GM-% IV SOLR
INTRAVENOUS | Status: AC
  Filled 2014-10-26: qty 50

## 2014-10-26 MED ORDER — DIPHENHYDRAMINE HCL 25 MG PO CAPS: 25.0000 mg | ORAL_CAPSULE | ORAL | Status: DC | PRN

## 2014-10-26 MED ORDER — BUPIVACAINE HCL (PF) 0.5 % IJ SOLN
INTRAMUSCULAR | Status: DC | PRN
  Administered 2014-10-26: 10.5 mg

## 2014-10-26 MED ORDER — BUPIVACAINE HCL (PF) 0.25 % IJ SOLN
INTRAMUSCULAR | Status: AC
  Filled 2014-10-26: qty 30

## 2014-10-26 MED ORDER — ACETAMINOPHEN 325 MG PO TABS
650.0000 mg | ORAL_TABLET | ORAL | Status: DC | PRN
  Administered 2014-10-27: 650 mg via ORAL
  Filled 2014-10-26 (×2): qty 2

## 2014-10-26 MED ORDER — BUPIVACAINE HCL (PF) 0.25 % IJ SOLN
INTRAMUSCULAR | Status: DC | PRN
  Administered 2014-10-26: 10 mL

## 2014-10-26 MED ORDER — IBUPROFEN 600 MG PO TABS
600.0000 mg | ORAL_TABLET | Freq: Four times a day (QID) | ORAL | Status: DC
  Administered 2014-10-26 – 2014-10-28 (×7): 600 mg via ORAL
  Filled 2014-10-26 (×7): qty 1

## 2014-10-26 MED ORDER — SENNOSIDES-DOCUSATE SODIUM 8.6-50 MG PO TABS
2.0000 | ORAL_TABLET | ORAL | Status: DC
  Administered 2014-10-26 – 2014-10-27 (×2): 2 via ORAL
  Filled 2014-10-26 (×2): qty 2

## 2014-10-26 MED ORDER — PHENYLEPHRINE 8 MG IN D5W 100 ML (0.08MG/ML) PREMIX OPTIME
INJECTION | INTRAVENOUS | Status: DC | PRN
  Administered 2014-10-26: 80 ug/min via INTRAVENOUS

## 2014-10-26 MED ORDER — FERROUS SULFATE 325 (65 FE) MG PO TABS
325.0000 mg | ORAL_TABLET | Freq: Two times a day (BID) | ORAL | Status: DC
  Administered 2014-10-26 – 2014-10-28 (×4): 325 mg via ORAL
  Filled 2014-10-26 (×4): qty 1

## 2014-10-26 MED ORDER — FLEET ENEMA 7-19 GM/118ML RE ENEM: 1.0000 | ENEMA | Freq: Every day | RECTAL | Status: DC | PRN

## 2014-10-26 MED ORDER — MEPERIDINE HCL 25 MG/ML IJ SOLN: 6.2500 mg | INTRAMUSCULAR | Status: DC | PRN

## 2014-10-26 MED ORDER — DIPHENHYDRAMINE HCL 25 MG PO CAPS: 25.0000 mg | ORAL_CAPSULE | Freq: Four times a day (QID) | ORAL | Status: DC | PRN

## 2014-10-26 MED ORDER — FENTANYL CITRATE (PF) 100 MCG/2ML IJ SOLN
INTRAMUSCULAR | Status: AC
  Filled 2014-10-26: qty 2

## 2014-10-26 MED ORDER — PROMETHAZINE HCL 25 MG/ML IJ SOLN: 6.2500 mg | INTRAMUSCULAR | Status: DC | PRN

## 2014-10-26 MED ORDER — BISACODYL 10 MG RE SUPP: 10.0000 mg | Freq: Every day | RECTAL | Status: DC | PRN

## 2014-10-26 MED ORDER — SCOPOLAMINE 1 MG/3DAYS TD PT72
MEDICATED_PATCH | TRANSDERMAL | Status: AC
  Filled 2014-10-26: qty 1

## 2014-10-26 MED ORDER — DIPHENHYDRAMINE HCL 50 MG/ML IJ SOLN: 12.5000 mg | INTRAMUSCULAR | Status: DC | PRN

## 2014-10-26 MED ORDER — MORPHINE SULFATE (PF) 0.5 MG/ML IJ SOLN
INTRAMUSCULAR | Status: DC | PRN
  Administered 2014-10-26: .15 ug via EPIDURAL

## 2014-10-26 MED ORDER — WITCH HAZEL-GLYCERIN EX PADS
1.0000 | MEDICATED_PAD | CUTANEOUS | Status: DC | PRN
Start: 2014-10-26 — End: 2014-10-28

## 2014-10-26 MED ORDER — OXYTOCIN 10 UNIT/ML IJ SOLN
40.0000 [IU] | INTRAVENOUS | Status: DC | PRN
  Administered 2014-10-26: 40 [IU] via INTRAVENOUS

## 2014-10-26 MED ORDER — SIMETHICONE 80 MG PO CHEW: 80.0000 mg | CHEWABLE_TABLET | ORAL | Status: DC | PRN

## 2014-10-26 MED ORDER — NALOXONE HCL 0.4 MG/ML IJ SOLN: 0.4000 mg | INTRAMUSCULAR | Status: DC | PRN

## 2014-10-26 MED ORDER — OXYTOCIN 10 UNIT/ML IJ SOLN
INTRAMUSCULAR | Status: AC
  Filled 2014-10-26: qty 4

## 2014-10-26 MED ORDER — LANOLIN HYDROUS EX OINT: 1.0000 "application " | TOPICAL_OINTMENT | CUTANEOUS | Status: DC | PRN

## 2014-10-26 MED ORDER — MENTHOL 3 MG MT LOZG: 1.0000 | LOZENGE | OROMUCOSAL | Status: DC | PRN

## 2014-10-26 MED ORDER — OXYCODONE-ACETAMINOPHEN 5-325 MG PO TABS
2.0000 | ORAL_TABLET | ORAL | Status: DC | PRN
  Administered 2014-10-27: 2 via ORAL
  Filled 2014-10-26: qty 2

## 2014-10-26 MED ORDER — KETOROLAC TROMETHAMINE 30 MG/ML IJ SOLN
30.0000 mg | Freq: Four times a day (QID) | INTRAMUSCULAR | Status: AC | PRN
  Administered 2014-10-26: 30 mg via INTRAVENOUS
  Filled 2014-10-26: qty 1

## 2014-10-26 MED ORDER — OXYCODONE-ACETAMINOPHEN 5-325 MG PO TABS
1.0000 | ORAL_TABLET | ORAL | Status: DC | PRN
  Administered 2014-10-27 – 2014-10-28 (×2): 1 via ORAL
  Filled 2014-10-26 (×3): qty 1

## 2014-10-26 MED ORDER — ONDANSETRON HCL 4 MG/2ML IJ SOLN
INTRAMUSCULAR | Status: AC
  Filled 2014-10-26: qty 2

## 2014-10-26 MED ORDER — SODIUM CHLORIDE 0.9 % IJ SOLN: 3.0000 mL | Freq: Two times a day (BID) | INTRAMUSCULAR | Status: DC

## 2014-10-26 MED ORDER — MORPHINE SULFATE 0.5 MG/ML IJ SOLN
INTRAMUSCULAR | Status: AC
  Filled 2014-10-26: qty 10

## 2014-10-26 MED ORDER — PHENYLEPHRINE 8 MG IN D5W 100 ML (0.08MG/ML) PREMIX OPTIME
INJECTION | INTRAVENOUS | Status: AC
  Filled 2014-10-26: qty 100

## 2014-10-26 MED ORDER — FENTANYL CITRATE (PF) 100 MCG/2ML IJ SOLN: 25.0000 ug | INTRAMUSCULAR | Status: DC | PRN

## 2014-10-26 MED ORDER — SODIUM CHLORIDE 0.9 % IV SOLN: 250.0000 mL | INTRAVENOUS | Status: DC

## 2014-10-26 SURGICAL SUPPLY — 35 items
BARRIER ADHS 3X4 INTERCEED (GAUZE/BANDAGES/DRESSINGS) ×2 IMPLANT
BENZOIN TINCTURE PRP APPL 2/3 (GAUZE/BANDAGES/DRESSINGS) IMPLANT
CLAMP CORD UMBIL (MISCELLANEOUS) ×2 IMPLANT
CLOTH BEACON ORANGE TIMEOUT ST (SAFETY) ×2 IMPLANT
DRAPE C SECTION CLR SCREEN (DRAPES) ×2 IMPLANT
DRAPE SHEET LG 3/4 BI-LAMINATE (DRAPES) ×2 IMPLANT
DRSG OPSITE POSTOP 4X10 (GAUZE/BANDAGES/DRESSINGS) ×2 IMPLANT
DURAPREP 26ML APPLICATOR (WOUND CARE) ×2 IMPLANT
ELECT REM PT RETURN 9FT ADLT (ELECTROSURGICAL) ×2
ELECTRODE REM PT RTRN 9FT ADLT (ELECTROSURGICAL) ×1 IMPLANT
GLOVE BIOGEL PI IND STRL 7.0 (GLOVE) ×1 IMPLANT
GLOVE BIOGEL PI INDICATOR 7.0 (GLOVE) ×1
GLOVE ECLIPSE 6.5 STRL STRAW (GLOVE) ×2 IMPLANT
GOWN STRL REUS W/TWL LRG LVL3 (GOWN DISPOSABLE) ×8 IMPLANT
NEEDLE HYPO 22GX1.5 SAFETY (NEEDLE) ×2 IMPLANT
NS IRRIG 1000ML POUR BTL (IV SOLUTION) ×2 IMPLANT
PACK C SECTION WH (CUSTOM PROCEDURE TRAY) ×2 IMPLANT
PAD ABD 7.5X8 STRL (GAUZE/BANDAGES/DRESSINGS) ×2 IMPLANT
PAD OB MATERNITY 4.3X12.25 (PERSONAL CARE ITEMS) ×2 IMPLANT
RTRCTR C-SECT PINK 25CM LRG (MISCELLANEOUS) ×2 IMPLANT
SPONGE GAUZE 4X4 12PLY STER LF (GAUZE/BANDAGES/DRESSINGS) ×2 IMPLANT
STAPLER VISISTAT 35W (STAPLE) ×2 IMPLANT
STRIP CLOSURE SKIN 1/2X4 (GAUZE/BANDAGES/DRESSINGS) IMPLANT
SUT MNCRL 0 VIOLET CTX 36 (SUTURE) ×3 IMPLANT
SUT MONOCRYL 0 CTX 36 (SUTURE) ×3
SUT PLAIN 2 0 (SUTURE) ×1
SUT PLAIN ABS 2-0 CT1 27XMFL (SUTURE) ×1 IMPLANT
SUT VIC AB 0 CT1 36 (SUTURE) ×4 IMPLANT
SUT VIC AB 2-0 CT1 27 (SUTURE) ×1
SUT VIC AB 2-0 CT1 TAPERPNT 27 (SUTURE) ×1 IMPLANT
SUT VIC AB 4-0 PS2 27 (SUTURE) IMPLANT
SYR CONTROL 10ML LL (SYRINGE) ×2 IMPLANT
TAPE CLOTH SURG 4X10 WHT LF (GAUZE/BANDAGES/DRESSINGS) ×2 IMPLANT
TOWEL OR 17X24 6PK STRL BLUE (TOWEL DISPOSABLE) ×2 IMPLANT
TRAY FOLEY CATH SILVER 14FR (SET/KITS/TRAYS/PACK) ×2 IMPLANT

## 2014-10-26 NOTE — Anesthesia Procedure Notes (Addendum)
Spinal Patient location during procedure: OR Start time: 10/26/2014 7:53 AM Staffing Anesthesiologist: Josephine Igo Performed by: anesthesiologist  Preanesthetic Checklist Completed: patient identified, site marked, surgical consent, pre-op evaluation, timeout performed, IV checked, risks and benefits discussed and monitors and equipment checked Spinal Block Patient position: sitting Prep: site prepped and draped and DuraPrep Patient monitoring: heart rate, cardiac monitor, continuous pulse ox and blood pressure Approach: midline Location: L3-4 Injection technique: single-shot Needle Needle type: Sprotte  Needle gauge: 24 G Needle length: 9 cm Needle insertion depth: 5 cm Assessment Sensory level: T4 Additional Notes Patient tolerated procedure well. Adequate sensory level.

## 2014-10-26 NOTE — Addendum Note (Signed)
Addendum  created 10/26/14 1426 by Jonna Munro, CRNA   Modules edited: Notes Section   Notes Section:  File: [id number]

## 2014-10-26 NOTE — Anesthesia Postprocedure Evaluation (Signed)
Anesthesia Post Note  Patient: Nicole Clark  Procedure(s) Performed: Procedure(s) (LRB): Repeat CESAREAN SECTION (N/A)  Anesthesia type: sab  Patient location: PACU  Post pain: Pain level controlled  Post assessment: Patient's Cardiovascular Status Stable  Last Vitals:  Filed Vitals:   10/26/14 1058  BP: 99/56  Pulse: 62  Temp: 36.6 C  Resp: 18    Post vital signs: Reviewed and stable  Level of consciousness: sedated  Complications: No apparent anesthesia complications

## 2014-10-26 NOTE — Anesthesia Preprocedure Evaluation (Signed)
Anesthesia Evaluation  Patient identified by MRN, date of birth, ID band Patient awake    Reviewed: Allergy & Precautions, NPO status , Patient's Chart, lab work & pertinent test results  Airway Mallampati: II  TM Distance: >3 FB Neck ROM: Full    Dental  (+) Teeth Intact, Dental Advisory Given   Pulmonary neg pulmonary ROS,    Pulmonary exam normal       Cardiovascular negative cardio ROS Normal cardiovascular exam    Neuro/Psych negative neurological ROS  negative psych ROS   GI/Hepatic negative GI ROS, Neg liver ROS,   Endo/Other  diabetes  Renal/GU negative Renal ROS     Musculoskeletal   Abdominal   Peds  Hematology   Anesthesia Other Findings   Reproductive/Obstetrics                             Anesthesia Physical Anesthesia Plan  ASA: II  Anesthesia Plan: Spinal   Post-op Pain Management:    Induction:   Airway Management Planned: Simple Face Mask  Additional Equipment:   Intra-op Plan:   Post-operative Plan:   Informed Consent: I have reviewed the patients History and Physical, chart, labs and discussed the procedure including the risks, benefits and alternatives for the proposed anesthesia with the patient or authorized representative who has indicated his/her understanding and acceptance.   Dental advisory given  Plan Discussed with: CRNA, Anesthesiologist and Surgeon  Anesthesia Plan Comments:         Anesthesia Quick Evaluation

## 2014-10-26 NOTE — Anesthesia Postprocedure Evaluation (Signed)
  Anesthesia Post-op Note  Patient: Nicole Clark  Procedure(s) Performed: Procedure(s) with comments: Repeat CESAREAN SECTION (N/A) - EDD: 11/01/14  Patient Location: Mother/Baby  Anesthesia Type:Spinal  Level of Consciousness: awake, alert  and oriented  Airway and Oxygen Therapy: Patient Spontanous Breathing  Post-op Pain: none  Post-op Assessment: Post-op Vital signs reviewed, Patient's Cardiovascular Status Stable, Respiratory Function Stable, Pain level controlled, No headache, No backache, No residual numbness and No residual motor weakness  Post-op Vital Signs: Reviewed and stable  Last Vitals:  Filed Vitals:   10/26/14 1300  BP: 98/47  Pulse: 70  Temp: 36.8 C  Resp: 18    Complications: No apparent anesthesia complications

## 2014-10-26 NOTE — Lactation Note (Signed)
This note was copied from the chart of Nicole Clark. Lactation Consultation Note  Patient Name: Nicole Clark [id number] Date: 10/26/2014 Reason for consult: Initial assessment Nicole Clark just latched baby when I arrived. Assisted Nicole Clark to obtain more depth. Baby demonstrating few good suckling bursts, swallowing motions observed. Basic teaching reviewed with Nicole Clark. She is experienced BF. Advised to BF with feeding ques. Nicole Clark denies other questions/concerns. Lactation brochure left for review, advised of OP services and support group. Encouraged Nicole Clark to call for assist if desired.   Maternal Data Has patient been taught Hand Expression?: Yes Does the patient have breastfeeding experience prior to this delivery?: Yes  Feeding Feeding Type: Breast Fed Length of feed: 10 min  LATCH Score/Interventions Latch: Grasps breast easily, tongue down, lips flanged, rhythmical sucking.  Audible Swallowing: None  Type of Nipple: Everted at rest and after stimulation  Comfort (Breast/Nipple): Soft / non-tender     Hold (Positioning): Assistance needed to correctly position infant at breast and maintain latch. Intervention(s): Breastfeeding basics reviewed;Support Pillows;Position options;Skin to skin  LATCH Score: 7  Lactation Tools Discussed/Used WIC Program: No   Consult Status Consult Status: Follow-up Date: 10/27/14 Follow-up type: In-patient    Katrine Coho 10/26/2014, 5:42 PM

## 2014-10-26 NOTE — Brief Op Note (Signed)
10/26/2014  9:05 AM  PATIENT:  Nicole Clark  34 y.o. female  PRE-OPERATIVE DIAGNOSIS:  Previous Cesarean Section, Class A Gestational Diabetes, Fetal Macrosomia, term gestation  POST-OPERATIVE DIAGNOSIS:  Previous Cesarean Section, Class A Gestational Diabetes, Fetal Macrosomia, term gestation  PROCEDURE:  Repeat cesarean section, kerr hysterotomy  SURGEON:  Surgeon(s) and Role:    * Servando Salina, MD - Primary  PHYSICIAN ASSISTANT:   ASSISTANTS: Artelia Laroche, CNM   ANESTHESIA:   spinal Findings: live female, nl tubes and ovaries. Apgar 8/9, 9lb 9 oz EBL:  Total I/O In: 2400 [I.V.:2400] Out: 975 [Urine:175; Blood:800]  BLOOD ADMINISTERED:none  DRAINS: none   LOCAL MEDICATIONS USED:  MARCAINE     SPECIMEN:  Source of Specimen:  placenta  DISPOSITION OF SPECIMEN:  N/A  COUNTS:  YES  TOURNIQUET:  * No tourniquets in log *  DICTATION: .Other Dictation: Dictation Number [id number]  PLAN OF CARE: Admit to inpatient   PATIENT DISPOSITION:  PACU - hemodynamically stable.   Delay start of Pharmacological VTE agent (>24hrs) due to surgical blood loss or risk of bleeding: no

## 2014-10-26 NOTE — Transfer of Care (Signed)
Immediate Anesthesia Transfer of Care Note  Patient: Nicole Clark  Procedure(s) Performed: Procedure(s) with comments: Repeat CESAREAN SECTION (N/A) - EDD: 11/01/14  Patient Location: PACU  Anesthesia Type:Spinal  Level of Consciousness: awake, alert  and oriented  Airway & Oxygen Therapy: Patient Spontanous Breathing  Post-op Assessment: Report given to RN  Post vital signs: Reviewed and stable  Last Vitals:  Filed Vitals:   10/26/14 0623  BP: 110/56  Pulse: 100  Temp: 36.9 C  Resp: 18    Complications: No apparent anesthesia complications

## 2014-10-27 ENCOUNTER — Encounter (HOSPITAL_COMMUNITY): Payer: Self-pay | Admitting: Obstetrics and Gynecology

## 2014-10-27 LAB — CBC
HEMATOCRIT: 31.8 % — AB (ref 36.0–46.0)
HEMOGLOBIN: 10.7 g/dL — AB (ref 12.0–15.0)
MCH: 30.9 pg (ref 26.0–34.0)
MCHC: 33.6 g/dL (ref 30.0–36.0)
MCV: 91.9 fL (ref 78.0–100.0)
Platelets: 145 10*3/uL — ABNORMAL LOW (ref 150–400)
RBC: 3.46 MIL/uL — AB (ref 3.87–5.11)
RDW: 13.6 % (ref 11.5–15.5)
WBC: 7.7 10*3/uL (ref 4.0–10.5)

## 2014-10-27 LAB — CCBB MATERNAL DONOR DRAW

## 2014-10-27 MED ORDER — MAGNESIUM OXIDE 400 (241.3 MG) MG PO TABS
200.0000 mg | ORAL_TABLET | Freq: Every day | ORAL | Status: DC
  Administered 2014-10-27 – 2014-10-28 (×2): 200 mg via ORAL
  Filled 2014-10-27 (×2): qty 0.5

## 2014-10-27 NOTE — Op Note (Signed)
NAMELADAVIA, Nicole Clark                 ACCOUNT NO.:  [id number]  MEDICAL RECORD NO.:  [id number]  LOCATION:  9101                          FACILITY:  [facility]  PHYSICIAN:  Servando Salina, M.D.DATE OF BIRTH:  16-Aug-1980  DATE OF PROCEDURE:  10/26/2014 DATE OF DISCHARGE:                              OPERATIVE REPORT   PREOPERATIVE DIAGNOSES: 1. Previous cesarean section, 2. class A1 gestational diabetes.. 3. Fetal macrosomia. 4. Term gestation.  PROCEDURE:  Repeat cesarean section, Kerr hysterotomy.  POSTOPERATIVE DIAGNOSES: 1. Previous cesarean section,  2.class A1 gestational diabetes. 3. Term gestation. 4. Fetal macrosomia.  ANESTHESIA:  Spinal.  SURGEON:  Servando Salina, M.D.  ASSISTANT:  Artelia Laroche, CNM.  DESCRIPTION OF PROCEDURE:  Under adequate spinal anesthesia, the patient was placed in the supine position with a left lateral tilt.  An indwelling Foley catheter was sterilely placed.  0.25% Marcaine was injected along the previous Pfannenstiel skin incision site.  That skin incisions scar had keloid formation.  The incision was then made, carried down to the rectus fascia.  Rectus  fascia opened transversely. Rectus fascia was then bluntly and sharply dissected off the rectus muscle in a superior and inferior fashion.  The rectus muscle was ultimately split in the midline with sharp dissection.  The parietal peritoneum was entered bluntly and extended superiorly and inferiorly. Alexis self-retaining retractor was then placed.  Some adhesions to the lower uterine segment were noted.  Vesicouterine peritoneum was opened transversely.  The bladder was dissected off the lower uterine segment but limited mobility was noted in that respect.  A curvilinear low transverse uterine incision was then made and extended with bandage scissors.  Copious clear amniotic fluid was noted.  Subsequent delivery of a live female from the left occiput transverse position with a  compound presentation was accomplished.  The baby was bulb suctioned in the abdomen.  The cord was then clamped and cut.  The baby was transferred to the awaiting pediatricians, who assigned Apgars of 8 and 9 at 1 and 5 minutes respectively.  The placenta was spontaneous and intact.  Cord bloods were also obtained and the placenta was sent to Pathology for marginal cord insertion.  Uterine cavity was cleaned of debris.  Uterine incision had no extension, was closed in 2 layers, the first layer with 0 Monocryl running locked stitch, second layer was imbricated using 0 Monocryl suture.  Good hemostasis was achieved.  Both ovaries appeared to be polycystic in nature.  Normal tubes bilaterally.  The abdomen was irrigated and suctioned of debris.  The Alexis retractor was removed. Interceed was placed over the lower uterine segment in an inverted T fashion.  The parietal peritoneum was then closed with 2-0 Vicryl.  The rectus fascia was closed with 0 Vicryl x2.  The subcutaneous area was irrigated, small bleeders cauterized.  Interrupted 2-0 plain sutures placed and the keloid scar was removed and the skin approximated with Ethicon staples.  SPECIMEN:  Placenta sent to Pathology.  ESTIMATED BLOOD LOSS:  800 mL.  INTRAOPERATIVE FLUID:  2400 mL.  URINE OUTPUT:  175 mL.  Sponge and instrument counts x2 was correct.  COMPLICATIONS:  None.  The patient tolerated  the procedure well and was transferred to recovery in stable condition.  Baby was placed skin to skin.     Servando Salina, M.D.     [place]/MEDQ  D:  10/26/2014  T:  10/27/2014  Job:  [id number]

## 2014-10-27 NOTE — Progress Notes (Signed)
Patient ID: Nicole Clark, female   DOB: 07-06-1980, 34 y.o.   MRN: [id number] Subjective: POD# 1 Information for the patient's newborn:  Nicole, Clark [[id number]]  female   Reports feeling sore, but well Feeding: breast Patient reports tolerating PO.  Breast symptoms: none Pain controlled with ibuprofen (OTC) and narcotic analgesics including Percocet Denies HA/SOB/C/P/N/V/dizziness. Flatus present. No BM. She reports vaginal bleeding as normal, without clots.  She is ambulating, urinating without difficult.     Objective:   VS:  Filed Vitals:   10/26/14 1800 10/26/14 2020 10/27/14 0139 10/27/14 0533  BP: 92/65 90/55 112/56 100/52  Pulse: 60 75 53 51  Temp: 97.3 F (36.3 C) 97.9 F (36.6 C) 97.6 F (36.4 C) 97.5 F (36.4 C)  TempSrc: Oral Oral Oral Oral  Resp: 18 18 18 18   SpO2:  100% 100% 100%     Intake/Output Summary (Last 24 hours) at 10/27/14 1046 Last data filed at 10/27/14 0139  Gross per 24 hour  Intake 2175.42 ml  Output   1575 ml  Net 600.42 ml        Recent Labs  10/25/14 1500 10/27/14 0640  WBC 5.1 7.7  HGB 11.8* 10.7*  HCT 35.1* 31.8*  PLT 175 145*     Blood type: B POS (05/23 1500)  Rubella: Immune (11/04 0000)     Physical Exam:  General: alert, cooperative and no distress CV: Regular rate and rhythm, S1S2 present or without murmur or extra heart sounds Resp: clear Abdomen: soft, nontender, normal bowel sounds Incision: serous drainage present and Tegaderm and Honeycomb intact / skin well- approximated with staples Uterine Fundus: firm, 1 FB below umbilicus, appropriately tender with fundal massage Lochia: minimal Ext: extremities normal, atraumatic, no cyanosis or edema, Homans sign is negative, no sign of DVT and no edema, redness or tenderness in the calves or thighs - SCD in place   Assessment/Plan: 34 y.o.   POD# 1.  s/p Cesarean Delivery.  Indications: Repeat and fetal macrosomia                Principal Problem:   Postpartum  care following cesarean delivery (5/24)  Doing well, stable.               Regular diet as tolerated Ambulate Routine post-op care  Graceann Congress, MSN, CNM 10/27/2014, 10:46 AM

## 2014-10-28 MED ORDER — FERROUS SULFATE 325 (65 FE) MG PO TABS: 325.0000 mg | ORAL_TABLET | Freq: Every day | ORAL | Status: DC

## 2014-10-28 MED ORDER — IBUPROFEN 600 MG PO TABS: 600.0000 mg | ORAL_TABLET | Freq: Four times a day (QID) | ORAL | Status: DC

## 2014-10-28 MED ORDER — OXYCODONE-ACETAMINOPHEN 5-325 MG PO TABS: 1.0000 | ORAL_TABLET | ORAL | Status: DC | PRN

## 2014-10-28 MED ORDER — MAGNESIUM 300 MG PO CAPS: 300.0000 mg | ORAL_CAPSULE | Freq: Every day | ORAL | Status: DC

## 2014-10-28 NOTE — Discharge Summary (Signed)
POSTOPERATIVE DISCHARGE SUMMARY:  Patient ID: Nicole Clark MRN: [id number] DOB/AGE: 33/08/1980 33 y.o.  Admit date: 10/26/2014 Admission Diagnoses: 39.1 weeks / previous cesarean section / GDMa1 / fetal macrosomia Discharge date:   Discharge Diagnoses: POD 2 s/p CS -repeat  / GDMa1- delivered / fetal macrosomia / mild ABL anemia  Prenatal history: [id number]   EDC : 11/01/2014, by Last Menstrual Period  Prenatal care at [facility]  Primary provider : [place] Prenatal course complicated by fibroids / previous CS / constipation / GDMa1  Prenatal Labs: ABO, Rh: --/--/B POS (05/23 1500) Antibody: NEG (05/23 1500) Rubella: Immune (11/04 0000)   RPR: Non Reactive (05/23 1500)  HBsAg: Negative (11/04 0000)  HIV: Non-reactive (11/04 0000)    Medical / Surgical History :  Past medical history:  Past Medical History  Diagnosis Date  . Fibroids   . Complication of anesthesia     epidural in labor was ineffective  . Gestational diabetes 2016    diet controlled    Past surgical history:  Past Surgical History  Procedure Laterality Date  . Dilation and curettage of uterus    . Cesarean section  05/01/2011    Procedure: CESAREAN SECTION;  Surgeon: Margarette Asal;  Location: [facility];  Service: Gynecology;  Laterality: N/A;  Primary cesarean section with delivery of baby boy at 25. Apgars 9/9.  Marland Kitchen Wisdom tooth extraction    . Cesarean section N/A 10/26/2014    Procedure: Repeat CESAREAN SECTION;  Surgeon: Servando Salina, MD;  Location: [facility];  Service: Obstetrics;  Laterality: N/A;  EDD: 11/01/14    Family History:  Family History  Problem Relation Age of Onset  . Mental illness Mother     anxiety  . Nephrolithiasis Mother   . Diabetes Mother   . Hypertension Mother   . Diabetes Father   . Cancer Maternal Grandmother     Pancreatic    Social History:  reports that she has never smoked. She has never used smokeless tobacco. She reports that she does not  drink alcohol or use illicit drugs.  Allergies: Review of patient's allergies indicates no known allergies.   Current Medications at time of admission:  Prior to Admission medications   Medication Sig Start Date End Date Taking? Authorizing Provider  glycerin adult 2 G SUPP Place 1 suppository rectally daily as needed for moderate constipation.   Yes Historical Provider, MD  polyethylene glycol (MIRALAX / GLYCOLAX) packet Take 17 g by mouth daily as needed for mild constipation.   Yes Historical Provider, MD  Prenatal Vit-Fe Fumarate-FA (PRENATAL MULTIVITAMIN) TABS tablet Take 1 tablet by mouth daily at 12 noon.   Yes Historical Provider, MD    Procedures: Cesarean section delivery on 10/26/2014 with delivery of female newborn by Dr Garwin Brothers   See operative report for further details APGAR (1 MIN): 8   APGAR (5 MINS): 9    Postoperative / postpartum course:  Uncomplicated with discharge on POD 2   Discharge Instructions:  Discharged Condition: stable  Activity: pelvic rest and postoperative restrictions x 2   Diet: routine  Medications:    Medication List    STOP taking these medications        meloxicam 15 MG tablet  Commonly known as:  MOBIC      TAKE these medications        ferrous sulfate 325 (65 FE) MG tablet  Take 1 tablet (325 mg total) by mouth daily with breakfast.  glycerin adult 2 G Supp  Place 1 suppository rectally daily as needed for moderate constipation.     ibuprofen 600 MG tablet  Commonly known as:  ADVIL,MOTRIN  Take 1 tablet (600 mg total) by mouth every 6 (six) hours.     Magnesium 300 MG Caps  Take 1 capsule (300 mg total) by mouth daily at 6 (six) AM.     oxyCODONE-acetaminophen 5-325 MG per tablet  Commonly known as:  PERCOCET/ROXICET  Take 1 tablet by mouth every 4 (four) hours as needed (for pain scale 4-7).     polyethylene glycol packet  Commonly known as:  MIRALAX / GLYCOLAX  Take 17 g by mouth daily as needed for mild  constipation.     predniSONE 10 MG tablet  Commonly known as:  STERAPRED UNI-PAK  6 DAY DOSE PAK. TAKE AD     prenatal multivitamin Tabs tablet  Take 1 tablet by mouth daily at 12 noon.        Wound Care: keep clean and dry /dressing and staple removal Tuesday at [facility] Postpartum Instructions: [facility] discharge booklet - instructions reviewed  Discharge to: Home  Follow up :  [facility] on 5/32 for interval visit with nurse for dressing and staple removal [facility] in 6 weeks for routine postpartum visit with Dr Garwin Brothers  6-12 weeks 2hr GTT                Signed: Artelia Laroche CNM, MSN, [facility] 10/28/2014, 9:10 AM

## 2014-10-28 NOTE — Progress Notes (Addendum)
POSTOPERATIVE DAY # 2 S/P CS  S:         Reports feeling well - interested in early DC maybe later this pm             Tolerating po intake / no nausea / no vomiting / + flatus / no BM             Bleeding is spotting, light             Pain controlled with motrin and percocet             Up ad lib / ambulatory/ voiding QS  Newborn breast feeding                   awaiting bilirubin level on newborn this am for Peds decision for DC today   O:  VS: BP 109/55 mmHg  Pulse 85  Temp(Src) 98.3 F (36.8 C) (Oral)  Resp 18  Ht 5\' 3"  (1.6 m)  Wt 81.647 kg (180 lb)  BMI 31.89 kg/m2  SpO2 100%  LMP 01/25/2014  Breastfeeding? Unknown   LABS:               Recent Labs  10/25/14 1500 10/27/14 0640  WBC 5.1 7.7  HGB 11.8* 10.7*  PLT 175 145*               Bloodtype: --/--/B POS (05/23 1500)  Rubella: Immune (11/04 0000)                                tdap current                       Physical Exam:             Alert and Oriented X3  Lungs: Clear and unlabored  Heart: regular rate and rhythm / no mumurs  Abdomen: soft, non-tender, non-distended active BS             Fundus: firm, non-tender, Ueven             Dressing intact honecycomb              Incision:  approximated with staples / no erythema / no ecchymosis / no drainage  Perineum: intact  Lochia: light  Extremities: trace edema, no calf pain or tenderness, TED in place  A:        POD # 2 S/P CS            Mild ABL anemia -stable  P:        Routine postoperative care              Possible DC today if newborn DC by Peds - revisit later pm   Artelia Laroche CNM, MSN, [facility] 10/28/2014, 8:21 AM     10/28/2014 @ 0900  notified by nursing - ok for DC by Peds / patient desires DC home this am DC instruction / WOB booklet / staple and dressing removal Tuesday at [facility] - call for apt

## 2014-10-29 LAB — TYPE AND SCREEN
ABO/RH(D): B POS
Antibody Screen: NEGATIVE
Unit division: 0
Unit division: 0

## 2016-03-26 ENCOUNTER — Other Ambulatory Visit: Payer: Self-pay | Admitting: Neurosurgery

## 2016-03-26 DIAGNOSIS — D352 Benign neoplasm of pituitary gland: Secondary | ICD-10-CM

## 2016-04-04 ENCOUNTER — Ambulatory Visit
Admission: RE | Admit: 2016-04-04 | Discharge: 2016-04-04 | Disposition: A | Discharge status: Home / Self Care | Payer: 59 | Source: Ambulatory Visit | Attending: Neurosurgery | Admitting: Neurosurgery

## 2016-04-04 DIAGNOSIS — D352 Benign neoplasm of pituitary gland: Secondary | ICD-10-CM

## 2016-04-04 MED ORDER — GADOBENATE DIMEGLUMINE 529 MG/ML IV SOLN
10.0000 mL | Freq: Once | INTRAVENOUS | Status: AC | PRN
  Administered 2016-04-04: 7 mL via INTRAVENOUS

## 2017-05-10 LAB — HM PAP SMEAR: HM Pap smear: NEGATIVE

## 2017-05-10 LAB — TSH: TSH: 0.59 (ref 0.41–5.90)

## 2017-06-18 ENCOUNTER — Other Ambulatory Visit: Payer: Self-pay | Admitting: Endocrinology

## 2017-06-18 ENCOUNTER — Other Ambulatory Visit: Payer: 59

## 2017-06-18 ENCOUNTER — Ambulatory Visit
Admission: RE | Admit: 2017-06-18 | Discharge: 2017-06-18 | Disposition: A | Discharge status: Home / Self Care | Payer: 59 | Source: Ambulatory Visit | Attending: Endocrinology | Admitting: Endocrinology

## 2017-06-18 DIAGNOSIS — E010 Iodine-deficiency related diffuse (endemic) goiter: Secondary | ICD-10-CM

## 2017-06-18 LAB — BASIC METABOLIC PANEL
BUN: 12 (ref 4–21)
Creatinine: 0.9 (ref 0.5–1.1)
GLUCOSE: 92
Potassium: 4.4 (ref 3.4–5.3)
Sodium: 137 (ref 137–147)

## 2017-06-20 ENCOUNTER — Other Ambulatory Visit: Payer: Self-pay | Admitting: Endocrinology

## 2017-06-20 DIAGNOSIS — E049 Nontoxic goiter, unspecified: Secondary | ICD-10-CM

## 2018-05-12 ENCOUNTER — Ambulatory Visit: Payer: 59 | Admitting: Family Medicine

## 2018-05-12 ENCOUNTER — Encounter: Payer: Self-pay | Admitting: Family Medicine

## 2018-05-12 VITALS — BP 98/60 | HR 78 | Temp 98.6°F | Resp 10 | Ht 64.0 in | Wt 159.8 lb

## 2018-05-12 DIAGNOSIS — D351 Benign neoplasm of parathyroid gland: Secondary | ICD-10-CM

## 2018-05-12 DIAGNOSIS — S161XXA Strain of muscle, fascia and tendon at neck level, initial encounter: Secondary | ICD-10-CM

## 2018-05-12 DIAGNOSIS — R1011 Right upper quadrant pain: Secondary | ICD-10-CM

## 2018-05-12 DIAGNOSIS — Z8632 Personal history of gestational diabetes: Secondary | ICD-10-CM

## 2018-05-12 DIAGNOSIS — D352 Benign neoplasm of pituitary gland: Secondary | ICD-10-CM | POA: Insufficient documentation

## 2018-05-12 DIAGNOSIS — Z131 Encounter for screening for diabetes mellitus: Secondary | ICD-10-CM

## 2018-05-12 HISTORY — DX: Strain of muscle, fascia and tendon at neck level, initial encounter: S16.1XXA

## 2018-05-12 LAB — POCT GLYCOSYLATED HEMOGLOBIN (HGB A1C): Hemoglobin A1C: 5.5 % (ref 4.0–5.6)

## 2018-05-12 NOTE — Assessment & Plan Note (Signed)
Intermittent and short lived. Ddx gallbladder, gas, msk. Favor gas vs msk given onset and quick resolution. Continue to monitor return if worsening

## 2018-05-12 NOTE — Assessment & Plan Note (Signed)
Suspect muscle strain. Overall reassuring exam. Home exercises and massage prescription given.

## 2018-05-12 NOTE — Progress Notes (Signed)
Subjective:     Nicole Clark is a 37 y.o. female presenting for Establish Care (no previous PCP.); Neck Pain (1 week ago along with some chest pain that lasted for that 1 day-Monday 05/05/18. ); and Abdominal Pain (right side. Off and on, about 4 times a year for the past few years.)     Neck Pain   This is a new problem. The current episode started in the past 7 days. The problem occurs daily. The problem has been gradually improving (but then restarted). The pain is associated with a sleep position. The pain is present in the right side. The quality of the pain is described as aching (stretching/pulling pain). Pain scale: initially 7 then improving. The pain is mild. The symptoms are aggravated by bending and position. Associated symptoms include chest pain (just started back yesterday). Pertinent negatives include no fever, headaches, numbness, tingling or weakness. Treatments tried: biofreeze, Icy/hot. The treatment provided mild relief.     #Abdominal Pain - for a few years - happen 4-5 times a year - sporadically - can happen anytime - driving - RQ - will need to massage  - no n/v during episodes - gets better w/in a few minutes - no association with food - improves w/ massage/rest    Review of Systems  Constitutional: Negative for fever.  Cardiovascular: Positive for chest pain (just started back yesterday).  Musculoskeletal: Positive for neck pain.  Neurological: Negative for tingling, weakness, numbness and headaches.     Social History   Tobacco Use  Smoking Status Never Smoker  Smokeless Tobacco Never Used        Objective:    BP Readings from Last 3 Encounters:  05/12/18 98/60  10/28/14 (!) 109/55  10/25/14 106/63   Wt Readings from Last 3 Encounters:  05/12/18 159 lb 12 oz (72.5 kg)  10/28/14 180 lb (81.6 kg)  10/25/14 180 lb 8 oz (81.9 kg)    BP 98/60   Pulse 78   Temp 98.6 F (37 C)   Resp 10   Ht 5\' 4"  (1.626 m)   Wt 159 lb 12 oz (72.5  kg)   SpO2 97%   BMI 27.42 kg/m    Physical Exam  Constitutional: She is oriented to person, place, and time. She appears well-developed and well-nourished. No distress.  HENT:  Head: Normocephalic and atraumatic.  Right Ear: External ear normal.  Left Ear: External ear normal.  Nose: Nose normal.  Mouth/Throat: Oropharynx is clear and moist.  Eyes: Conjunctivae and EOM are normal. No scleral icterus.  Neck: Full passive range of motion without pain. Neck supple. No spinous process tenderness and no muscular tenderness present. No neck rigidity. No erythema and normal range of motion present.  Cardiovascular: Normal rate and regular rhythm.  No murmur heard. Pulmonary/Chest: Effort normal and breath sounds normal. No respiratory distress. She has no wheezes.  Abdominal: Soft. Bowel sounds are normal. She exhibits no distension and no mass. There is no tenderness. There is no rebound and no guarding.  Musculoskeletal: Normal range of motion. She exhibits no edema.  Lymphadenopathy:    She has no cervical adenopathy.  Neurological: She is alert and oriented to person, place, and time. She has normal strength. She displays normal reflexes.  Skin: Skin is warm and dry. Capillary refill takes less than 2 seconds. She is not diaphoretic.  Psychiatric: She has a normal mood and affect. Her behavior is normal.   Hgb A1c: 5.5  Assessment & Plan:   Problem List Items Addressed This Visit      Endocrine   Benign tumor of parathyroid gland    Follows with specialist. Will obtain records        Musculoskeletal and Integument   Cervical strain - Primary    Suspect muscle strain. Overall reassuring exam. Home exercises and massage prescription given.         Other   Right upper quadrant abdominal pain    Intermittent and short lived. Ddx gallbladder, gas, msk. Favor gas vs msk given onset and quick resolution. Continue to monitor return if worsening       Other Visit  Diagnoses    History of gestational diabetes       Relevant Orders   POCT glycosylated hemoglobin (Hb A1C) (Completed)   Screening for diabetes mellitus (DM)         Negative screen for diabetes. Screened due to hx of gestational and family hx of disease. Monitor every 5 years  Return in about 1 year (around 05/13/2019), or if symptoms worsen or fail to improve.  Lesleigh Noe, MD

## 2018-05-12 NOTE — Patient Instructions (Addendum)
Neck Pain - Continue Icy/Hot and BioFreeze - Can try Ibuprofen or Tylenol - Do exercises - If worsening or not improving - return can consider PT - consider Massage  Abdominal pain - continue to monitor - may be gas or muscle - if worsening, return

## 2018-05-12 NOTE — Assessment & Plan Note (Signed)
Follows with specialist. Will obtain records

## 2018-05-15 ENCOUNTER — Encounter: Payer: Self-pay | Admitting: Obstetrics and Gynecology

## 2018-05-15 LAB — T3: T3, Total: 91

## 2018-05-15 LAB — T4, FREE: Free T4: 1.17

## 2018-06-05 LAB — INSULIN-LIKE GROWTH FACTOR: Insulin-Like GF-1: 257

## 2018-06-05 LAB — THYROID PEROXIDASE ANTIBODY: Thyroid Peroxidase Ab: 9

## 2018-06-05 LAB — PROLACTIN: Prolactin: 36.8

## 2018-06-16 DIAGNOSIS — E049 Nontoxic goiter, unspecified: Secondary | ICD-10-CM | POA: Diagnosis not present

## 2018-06-16 DIAGNOSIS — Z7689 Persons encountering health services in other specified circumstances: Secondary | ICD-10-CM | POA: Diagnosis not present

## 2018-06-16 DIAGNOSIS — D352 Benign neoplasm of pituitary gland: Secondary | ICD-10-CM | POA: Diagnosis not present

## 2018-06-24 DIAGNOSIS — D352 Benign neoplasm of pituitary gland: Secondary | ICD-10-CM | POA: Diagnosis not present

## 2018-06-27 DIAGNOSIS — Z01419 Encounter for gynecological examination (general) (routine) without abnormal findings: Secondary | ICD-10-CM | POA: Diagnosis not present

## 2018-06-27 DIAGNOSIS — Z6827 Body mass index (BMI) 27.0-27.9, adult: Secondary | ICD-10-CM | POA: Diagnosis not present

## 2018-06-27 DIAGNOSIS — R87610 Atypical squamous cells of undetermined significance on cytologic smear of cervix (ASC-US): Secondary | ICD-10-CM | POA: Diagnosis not present

## 2018-07-14 DIAGNOSIS — D352 Benign neoplasm of pituitary gland: Secondary | ICD-10-CM | POA: Diagnosis not present

## 2018-07-24 ENCOUNTER — Other Ambulatory Visit: Payer: Self-pay | Admitting: Endocrinology

## 2018-07-24 DIAGNOSIS — D352 Benign neoplasm of pituitary gland: Secondary | ICD-10-CM

## 2018-07-24 DIAGNOSIS — E220 Acromegaly and pituitary gigantism: Secondary | ICD-10-CM

## 2018-08-05 ENCOUNTER — Ambulatory Visit
Admission: RE | Admit: 2018-08-05 | Discharge: 2018-08-05 | Disposition: A | Discharge status: Home / Self Care | Payer: 59 | Source: Ambulatory Visit | Attending: Endocrinology | Admitting: Endocrinology

## 2018-08-05 DIAGNOSIS — E220 Acromegaly and pituitary gigantism: Secondary | ICD-10-CM | POA: Diagnosis not present

## 2018-08-05 DIAGNOSIS — D352 Benign neoplasm of pituitary gland: Secondary | ICD-10-CM

## 2018-08-05 MED ORDER — GADOBENATE DIMEGLUMINE 529 MG/ML IV SOLN
7.0000 mL | Freq: Once | INTRAVENOUS | Status: AC | PRN
  Administered 2018-08-05: 7 mL via INTRAVENOUS

## 2018-10-08 IMAGING — US US THYROID
1 series · 14 of 25 positions shown · non-contrast
Comparison: 06/01/2013 by report only

CLINICAL DATA: Thyromegaly

EXAM:
THYROID ULTRASOUND
TECHNIQUE: Ultrasound examination of the thyroid gland and adjacent soft
tissues was performed.

[Series 1: us thyroid · 0.06mm/px · 14 of 37 slices shown]
[im 1/37]
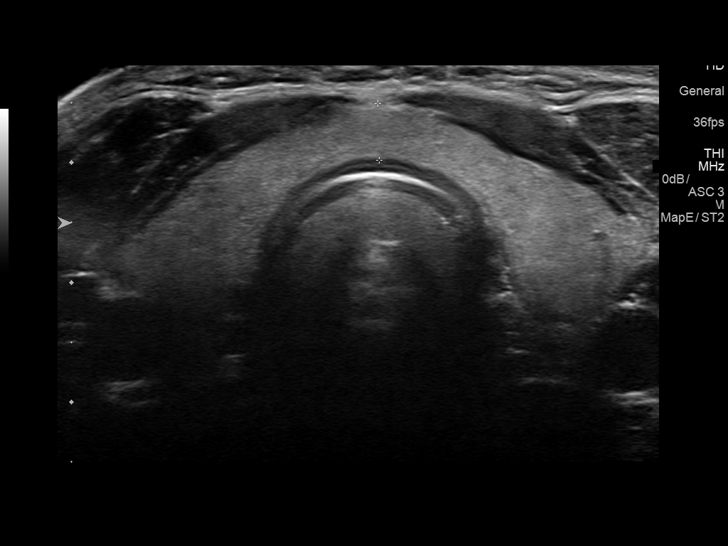
[im 4/37]
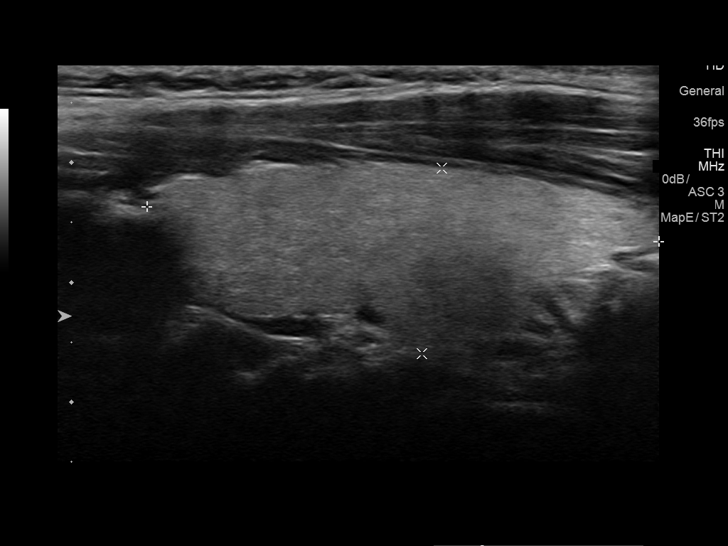
[im 7/37]
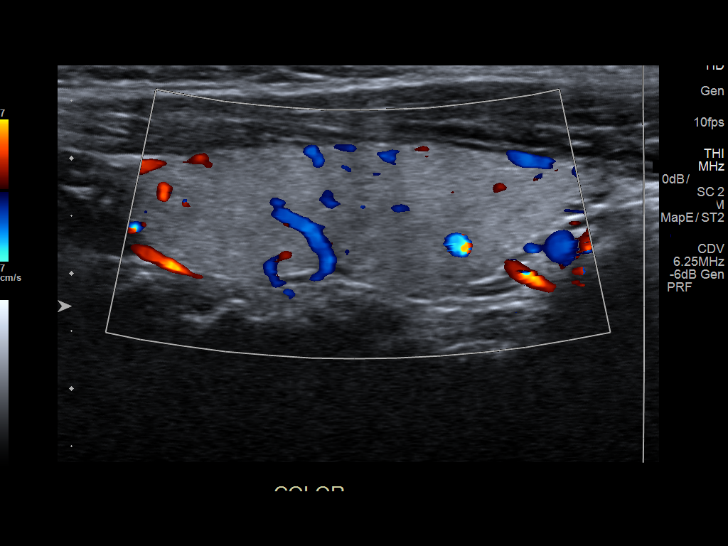
[im 10/37]
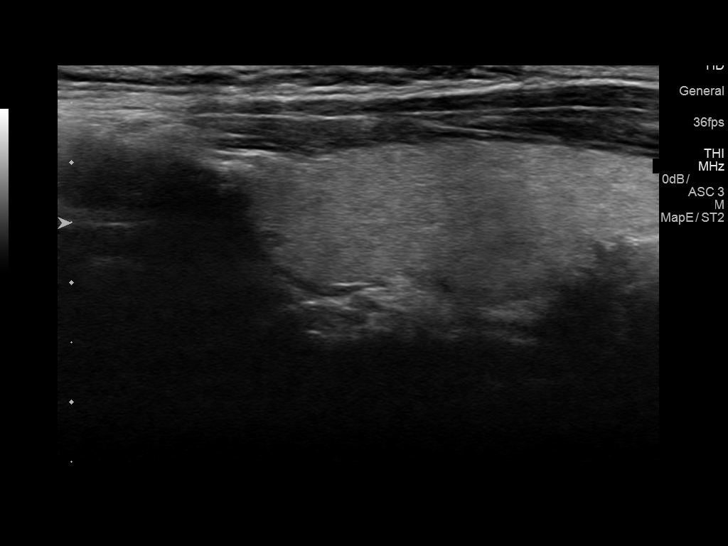
[im 13/37]
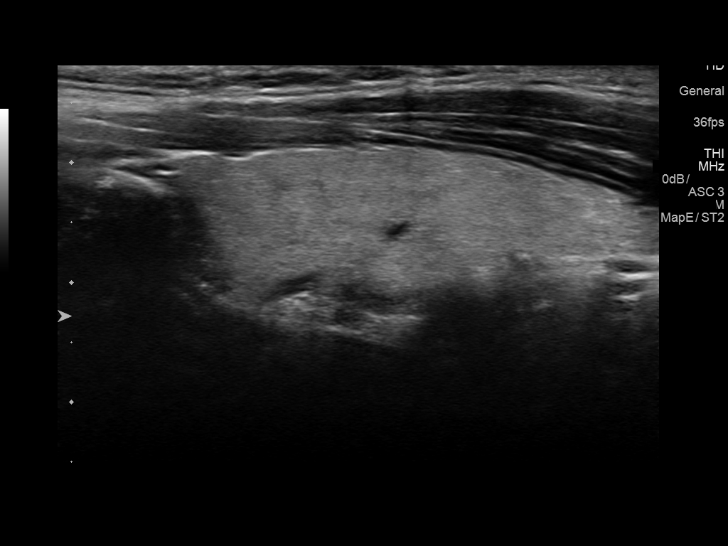
[im 14/37]
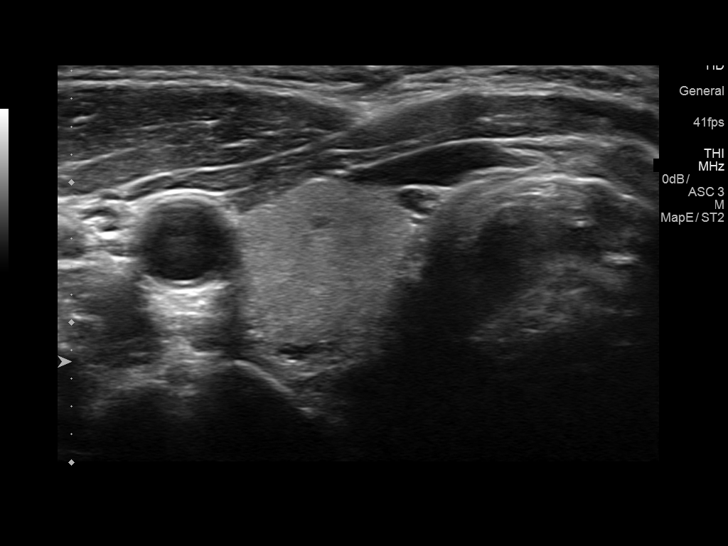
[im 17/37]
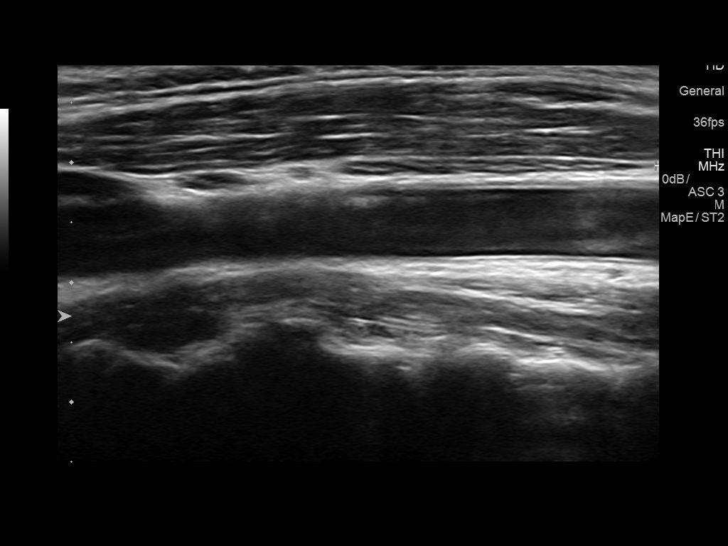
[im 20/37]
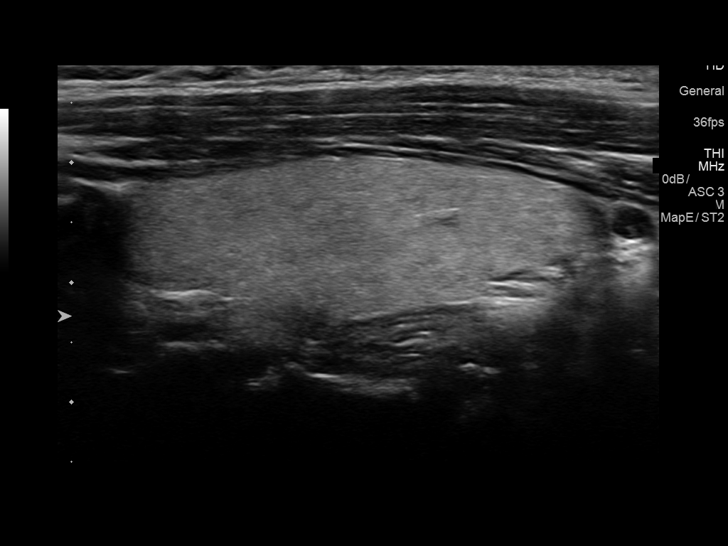
[im 23/37]
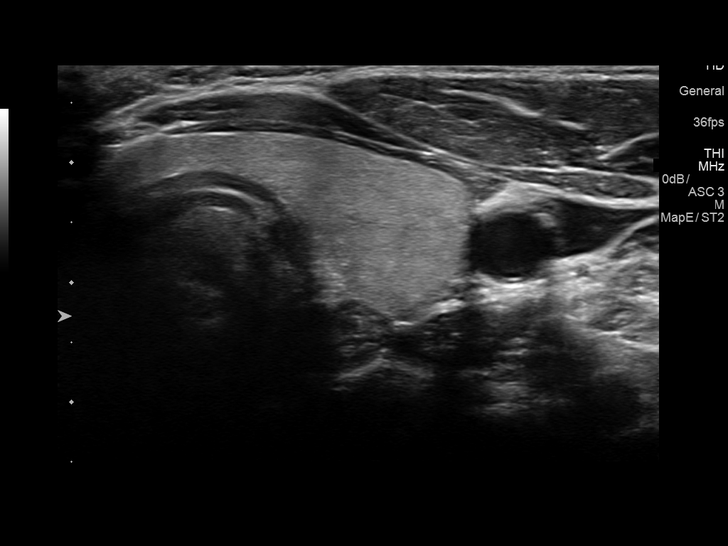
[im 25/37]
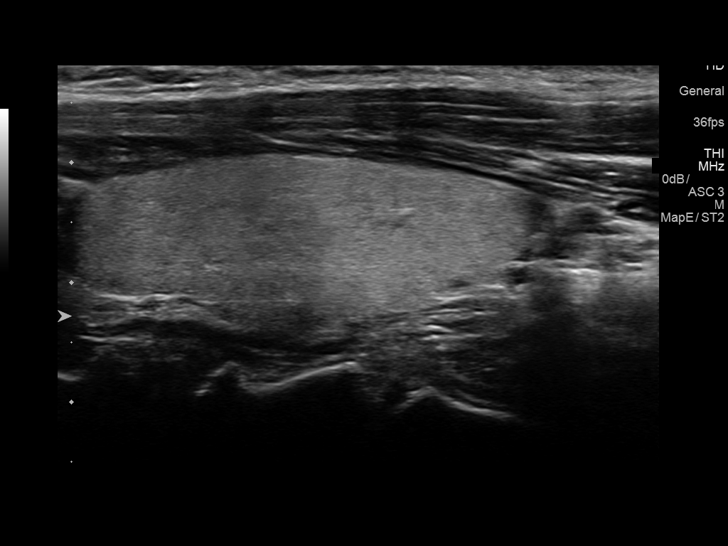
[im 28/37]
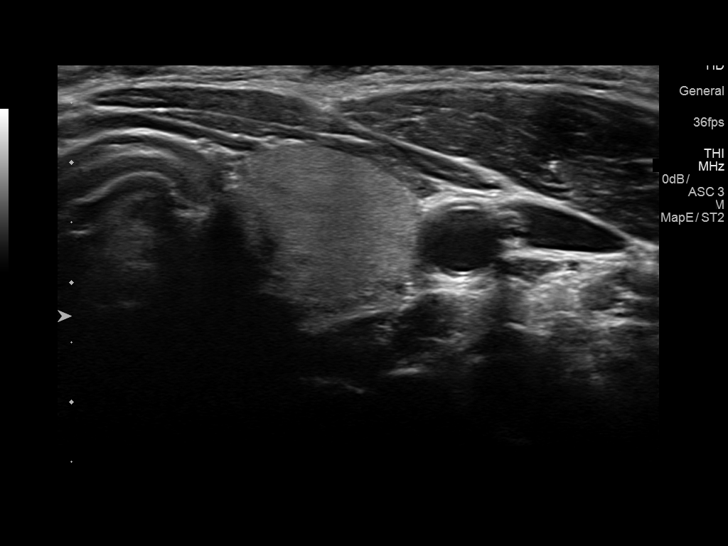
[im 31/37]
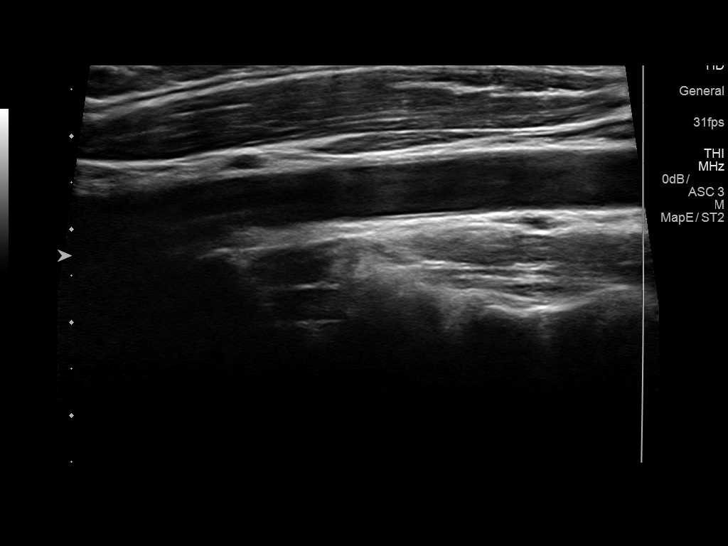
[im 34/37]
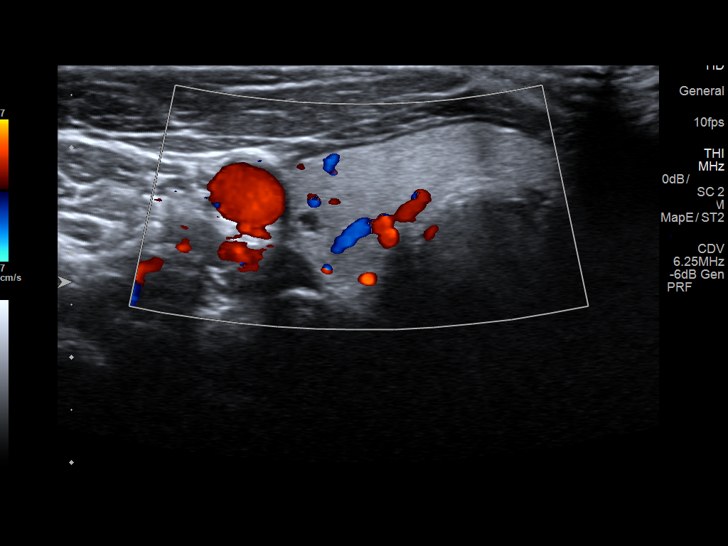
[im 37/37]
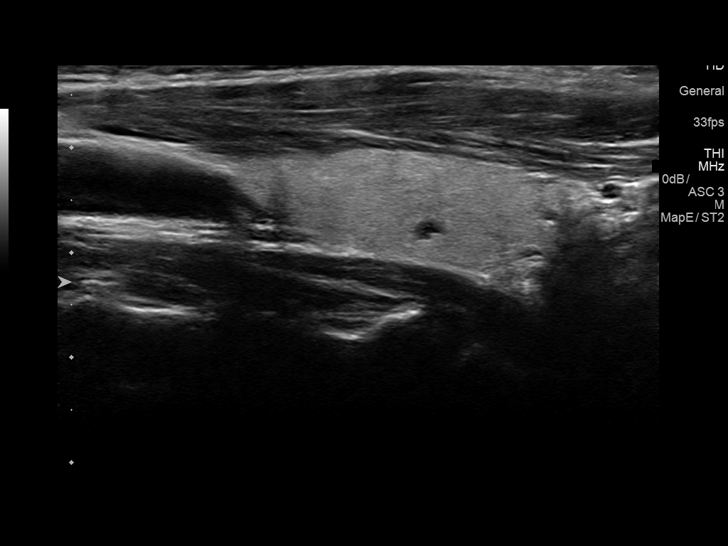

[14 of 25 positions shown; findings below may reference images not displayed]

FINDINGS: Parenchymal Echotexture: Normal

Isthmus: 0.5 cm thickness

Right lobe: 4.3 x 1.6 x 1.4 cm

Left lobe: 4.2 x 1.4 x 1.5 cm

_________________________________________________________

Estimated total number of nodules >/= 1 cm: 0

Number of spongiform nodules >/=  2 cm not described below (TR1): 0

Number of mixed cystic and solid nodules >/= 1.5 cm not described
below (TR2): 0

_________________________________________________________

No discrete nodules are seen within the thyroid gland. Small right
colloid cysts.
IMPRESSION: Normal

The above is in keeping with the ACR TI-RADS recommendations - [HOSPITAL] 7521;[DATE].

## 2019-04-01 ENCOUNTER — Encounter: Payer: Self-pay | Admitting: Family Medicine

## 2019-04-01 ENCOUNTER — Other Ambulatory Visit: Payer: Self-pay

## 2019-04-01 ENCOUNTER — Ambulatory Visit (INDEPENDENT_AMBULATORY_CARE_PROVIDER_SITE_OTHER): Payer: 59 | Admitting: Family Medicine

## 2019-04-01 VITALS — Wt 154.0 lb

## 2019-04-01 DIAGNOSIS — F411 Generalized anxiety disorder: Secondary | ICD-10-CM | POA: Insufficient documentation

## 2019-04-01 MED ORDER — HYDROXYZINE PAMOATE 25 MG PO CAPS: 25.0000 mg | ORAL_CAPSULE | Freq: Three times a day (TID) | ORAL | 1 refills | Status: DC | PRN

## 2019-04-01 NOTE — Patient Instructions (Signed)
How to help anxiety - without medication.   1) Regular Exercise - walking, jogging, cycling, dancing, strength training   2)  Begin a Mindfulness/Meditation practice -- this can take a little as 3 minutes and is helpful for all kinds of mood issues  -- You can find resources in books  -- Or you can download apps like  ---- Headspace App (which currently has free content called "Weathering the Storm")  ---- Calm (which has a few free options)  ---- Insignt Timer  ---- Stop, Breathe & Think   # With each of these Apps - you should decline the "start free trial" offer and as you search through the App should be able to access some of their free content. You can also chose to pay for the content if you find one that works well for you.   # Many of them also offer sleep specific content which may help with insomnia   3) Healthy Diet  -- Avoid or decrease Caffeine  -- Avoid or decrease Alcohol  -- Drink plenty of water, have a balanced diet  -- Avoid cigarettes and marijuana (as well as other recreational drugs)   4) Consider contacting a professional therapist   

## 2019-04-01 NOTE — Assessment & Plan Note (Signed)
2-3 months duration and GAD elevated. Discussed therapy and non-medication options (meditation, exercise) + hydroxyzine trial. Return in 4 weeks to discuss SSRI if not improvement

## 2019-04-01 NOTE — Progress Notes (Addendum)
I connected with Nicole Clark on 04/01/19 at  8:20 AM EDT by video and verified that I am speaking with the correct person using two identifiers.   I discussed the limitations, risks, security and privacy concerns of performing an evaluation and management service by video and the availability of in person appointments. I also discussed with the patient that there may be a patient responsible charge related to this service. The patient expressed understanding and agreed to proceed.  Patient location: work Provider Location: [facility] Participants: Lesleigh Noe and Nicole Clark   Subjective:     Nicole Clark is a 38 y.o. female presenting for Anxiety ( sx present for about 2 to 3 months.)     Anxiety Presents for initial visit. Onset was 6 to 12 months ago. Symptoms include decreased concentration, excessive worry, insomnia, irritability, nervous/anxious behavior and shortness of breath. Patient reports no chest pain, confusion, hyperventilation, palpitations or panic. The symptoms are aggravated by work stress (covid).   There are no known risk factors. Past treatments include lifestyle changes. The treatment provided no relief.     Review of Systems  Constitutional: Positive for irritability.  Respiratory: Positive for shortness of breath.   Cardiovascular: Negative for chest pain and palpitations.  Psychiatric/Behavioral: Positive for decreased concentration. Negative for confusion. The patient is nervous/anxious and has insomnia.      Social History   Tobacco Use  Smoking Status Never Smoker  Smokeless Tobacco Never Used        Objective:   BP Readings from Last 3 Encounters:  05/12/18 98/60  10/28/14 (!) 109/55  10/25/14 106/63   Wt Readings from Last 3 Encounters:  04/01/19 154 lb (69.9 kg)  05/12/18 159 lb 12 oz (72.5 kg)  10/28/14 180 lb (81.6 kg)   Wt 154 lb (69.9 kg) Comment: per patient  LMP 03/20/2019   BMI 26.43 kg/m     Physical Exam Constitutional:      Appearance: Normal appearance. She is not ill-appearing.  HENT:     Head: Normocephalic and atraumatic.     Right Ear: External ear normal.     Left Ear: External ear normal.  Eyes:     Conjunctiva/sclera: Conjunctivae normal.  Pulmonary:     Effort: Pulmonary effort is normal. No respiratory distress.  Neurological:     Mental Status: She is alert. Mental status is at baseline.  Psychiatric:        Mood and Affect: Mood normal.        Behavior: Behavior normal.        Thought Content: Thought content normal.        Judgment: Judgment normal.       GAD 7 : Generalized Anxiety Score 04/01/2019  Nervous, Anxious, on Edge 2  Control/stop worrying 2  Worry too much - different things 3  Trouble relaxing 2  Restless 3  Easily annoyed or irritable 3  Afraid - awful might happen 1  Total GAD 7 Score 16  Anxiety Difficulty Somewhat difficult           Assessment & Plan:   Problem List Items Addressed This Visit      Other   Generalized anxiety disorder - Primary    2-3 months duration and GAD elevated. Discussed therapy and non-medication options (meditation, exercise) + hydroxyzine trial. Return in 4 weeks to discuss SSRI if not improvement      Relevant Medications   hydrOXYzine (VISTARIL) 25 MG  capsule   Other Relevant Orders   Ambulatory referral to Psychology       Return in about 4 weeks (around 04/29/2019).  Lesleigh Noe, MD

## 2019-05-14 ENCOUNTER — Ambulatory Visit (INDEPENDENT_AMBULATORY_CARE_PROVIDER_SITE_OTHER): Payer: 59 | Admitting: Family Medicine

## 2019-05-14 ENCOUNTER — Encounter: Payer: Self-pay | Admitting: Family Medicine

## 2019-05-14 ENCOUNTER — Other Ambulatory Visit: Payer: Self-pay

## 2019-05-14 VITALS — BP 100/68 | HR 73 | Temp 97.1°F | Ht 64.25 in | Wt 156.0 lb

## 2019-05-14 DIAGNOSIS — Z0000 Encounter for general adult medical examination without abnormal findings: Secondary | ICD-10-CM

## 2019-05-14 DIAGNOSIS — Z83438 Family history of other disorder of lipoprotein metabolism and other lipidemia: Secondary | ICD-10-CM | POA: Diagnosis not present

## 2019-05-14 DIAGNOSIS — K429 Umbilical hernia without obstruction or gangrene: Secondary | ICD-10-CM | POA: Diagnosis not present

## 2019-05-14 DIAGNOSIS — D351 Benign neoplasm of parathyroid gland: Secondary | ICD-10-CM

## 2019-05-14 HISTORY — DX: Umbilical hernia without obstruction or gangrene: K42.9

## 2019-05-14 LAB — LIPID PANEL
Cholesterol: 222 mg/dL — ABNORMAL HIGH (ref 0–200)
HDL: 71.7 mg/dL (ref >39.00)
LDL Cholesterol: 144 mg/dL — ABNORMAL HIGH (ref 0–99)
NonHDL: 150.38
Total CHOL/HDL Ratio: 3
Triglycerides: 31 mg/dL (ref 0.0–149.0)
VLDL: 6.2 mg/dL (ref 0.0–40.0)

## 2019-05-14 NOTE — Patient Instructions (Signed)
Preventive Care 21-37 Years Old, Female Preventive care refers to visits with your health care provider and lifestyle choices that can promote health and wellness. This includes:  A yearly physical exam. This may also be called an annual well check.  Regular dental visits and eye exams.  Immunizations.  Screening for certain conditions.  Healthy lifestyle choices, such as eating a healthy diet, getting regular exercise, not using drugs or products that contain nicotine and tobacco, and limiting alcohol use. What can I expect for my preventive care visit? Physical exam Your health care provider will check your:  Height and weight. This may be used to calculate body mass index (BMI), which tells if you are at a healthy weight.  Heart rate and blood pressure.  Skin for abnormal spots. Counseling Your health care provider may ask you questions about your:  Alcohol, tobacco, and drug use.  Emotional well-being.  Home and relationship well-being.  Sexual activity.  Eating habits.  Work and work environment.  Method of birth control.  Menstrual cycle.  Pregnancy history. What immunizations do I need?  Influenza (flu) vaccine  This is recommended every year. Tetanus, diphtheria, and pertussis (Tdap) vaccine  You may need a Td booster every 10 years. Varicella (chickenpox) vaccine  You may need this if you have not been vaccinated. Human papillomavirus (HPV) vaccine  If recommended by your health care provider, you may need three doses over 6 months. Measles, mumps, and rubella (MMR) vaccine  You may need at least one dose of MMR. You may also need a second dose. Meningococcal conjugate (MenACWY) vaccine  One dose is recommended if you are age 19-21 years and a first-year college student living in a residence hall, or if you have one of several medical conditions. You may also need additional booster doses. Pneumococcal conjugate (PCV13) vaccine  You may need  this if you have certain conditions and were not previously vaccinated. Pneumococcal polysaccharide (PPSV23) vaccine  You may need one or two doses if you smoke cigarettes or if you have certain conditions. Hepatitis A vaccine  You may need this if you have certain conditions or if you travel or work in places where you may be exposed to hepatitis A. Hepatitis B vaccine  You may need this if you have certain conditions or if you travel or work in places where you may be exposed to hepatitis B. Haemophilus influenzae type b (Hib) vaccine  You may need this if you have certain conditions. You may receive vaccines as individual doses or as more than one vaccine together in one shot (combination vaccines). Talk with your health care provider about the risks and benefits of combination vaccines. What tests do I need?  Blood tests  Lipid and cholesterol levels. These may be checked every 5 years starting at age 20.  Hepatitis C test.  Hepatitis B test. Screening  Diabetes screening. This is done by checking your blood sugar (glucose) after you have not eaten for a while (fasting).  Sexually transmitted disease (STD) testing.  BRCA-related cancer screening. This may be done if you have a family history of breast, ovarian, tubal, or peritoneal cancers.  Pelvic exam and Pap test. This may be done every 3 years starting at age 21. Starting at age 30, this may be done every 5 years if you have a Pap test in combination with an HPV test. Talk with your health care provider about your test results, treatment options, and if necessary, the need for more tests.   Follow these instructions at home: Eating and drinking   Eat a diet that includes fresh fruits and vegetables, whole grains, lean protein, and low-fat dairy.  Take vitamin and mineral supplements as recommended by your health care provider.  Do not drink alcohol if: ? Your health care provider tells you not to drink. ? You are  pregnant, may be pregnant, or are planning to become pregnant.  If you drink alcohol: ? Limit how much you have to 0-1 drink a day. ? Be aware of how much alcohol is in your drink. In the [place] one drink equals one 12 oz bottle of beer (355 mL), one 5 oz glass of wine (148 mL), or one 1 oz glass of hard liquor (44 mL). Lifestyle  Take daily care of your teeth and gums.  Stay active. Exercise for at least 30 minutes on 5 or more days each week.  Do not use any products that contain nicotine or tobacco, such as cigarettes, e-cigarettes, and chewing tobacco. If you need help quitting, ask your health care provider.  If you are sexually active, practice safe sex. Use a condom or other form of birth control (contraception) in order to prevent pregnancy and STIs (sexually transmitted infections). If you plan to become pregnant, see your health care provider for a preconception visit. What's next?  Visit your health care provider once a year for a well check visit.  Ask your health care provider how often you should have your eyes and teeth checked.  Stay up to date on all vaccines. This information is not intended to replace advice given to you by your health care provider. Make sure you discuss any questions you have with your health care provider. Document Released: 07/17/2001 Document Revised: 01/30/2018 Document Reviewed: 01/30/2018 Elsevier Patient Education  2020 [employer]  

## 2019-05-14 NOTE — Assessment & Plan Note (Signed)
Was seeing endocrinology and told she may need surgery but reports normal MRI. No symptoms currently, would like a second opinion

## 2019-05-14 NOTE — Assessment & Plan Note (Signed)
Surgery referral. Discussed that may also be a diastasis recti which is playing in. Does endorse some pain but no obstruction history.

## 2019-05-14 NOTE — Progress Notes (Signed)
Annual Exam   Chief Complaint:  Chief Complaint  Patient presents with  . Annual Exam    History of Present Illness:  Ms. Nicole Clark is a 38 y.o. [phone] who LMP was No LMP recorded., presents today for her annual examination.    #anxiety - made her extremely sleepy - hydroxyzine - took it in th evening - 8:30 pm and fell asleep immediately - felt like the next day was not as anxious - seemed to help some with the next day  - has been doing lifestyle and mindfulness and exercise - needs to go to bed earlier  ?#hernia - bludge - occasionally painful - 2 c-sections  Nutrition/Lifestyle Diet: everything - veggies, meat, water, coffee only 2-3 days a week Exercise: running and walk at least 2 days a week She does get adequate calcium and Vitamin D in her diet.  Social History   Tobacco Use  Smoking Status Never Smoker  Smokeless Tobacco Never Used   Social History   Substance and Sexual Activity  Alcohol Use Yes  . Alcohol/week: 3.0 standard drinks  . Types: 3 Shots of liquor per week   Comment: once a week   Social History   Substance and Sexual Activity  Drug Use No     Safety The patient wears seatbelts: yes.     The patient feels safe at home and in their relationships: yes.  General Health Dentist in the last year: Yes Eye doctor: yes   Cervical Cancer Screening (Age 46-65) Last Pap:  05/2018 and normal Family History of Breast Cancer: no Family History of Ovarian Cancer: no   Sexual Health/Menses Her menses are regular every 28-30 days, lasting 6-7 day(s).  Dysmenorrhea moderate, occurring premenstrually. She does not have intermenstrual bleeding. She is single partner, contraception - vasectomy.   Weight Wt Readings from Last 3 Encounters:  05/14/19 156 lb (70.8 kg)  04/01/19 154 lb (69.9 kg)  05/12/18 159 lb 12 oz (72.5 kg)   Patient has normal BMI  BMI Readings from Last 1 Encounters:  05/14/19 26.57 kg/m     Chronic disease  screening Blood pressure monitoring:  BP Readings from Last 3 Encounters:  05/14/19 100/68  05/12/18 98/60  10/28/14 (!) 109/55     Lipid Monitoring: Indication for screening: age >33, obesity, diabetes, family hx, CV risk factors.  Lipid screening: Yes  No results found for: CHOL, HDL, LDLCALC, LDLDIRECT, TRIG, CHOLHDL   Diabetes Screening: age >66, overweight, family hx, PCOS, hx of gestational diabetes, at risk ethnicity, elevated blood pressure >135/80.  Diabetes Screening screening: No  Lab Results  Component Value Date   HGBA1C 5.5 05/12/2018      Past Medical History:  Diagnosis Date  . Benign tumor of parathyroid gland   . Complication of anesthesia    epidural in labor was ineffective  . Fibroids   . GERD (gastroesophageal reflux disease)   . Gestational diabetes 2016   diet controlled  . History of chicken pox     Past Surgical History:  Procedure Laterality Date  . CESAREAN SECTION  05/01/2011   Procedure: CESAREAN SECTION;  Surgeon: Margarette Asal;  Location: [facility];  Service: Gynecology;  Laterality: N/A;  Primary cesarean section with delivery of baby boy at 56. Apgars 9/9.  Marland Kitchen CESAREAN SECTION N/A 10/26/2014   Procedure: Repeat CESAREAN SECTION;  Surgeon: Servando Salina, MD;  Location: [facility];  Service: Obstetrics;  Laterality: N/A;  EDD: 11/01/14  . DILATION AND CURETTAGE OF  UTERUS    . DILATION AND CURETTAGE OF UTERUS  09/2008  . WISDOM TOOTH EXTRACTION      Prior to Admission medications   Medication Sig Start Date End Date Taking? Authorizing Provider  hydrOXYzine (VISTARIL) 25 MG capsule Take 1-2 capsules (25-50 mg total) by mouth 3 (three) times daily as needed for anxiety. 04/01/19  Yes Lesleigh Noe, MD  naproxen sodium (ALEVE) 220 MG tablet Take 220 mg by mouth daily as needed.   Yes [provider]  Pediatric Multiple Vit-C-FA (FLINSTONES GUMMIES OMEGA-3 DHA PO) Take 1 tablet by mouth daily.   Yes [provider]     No Known Allergies  Gynecologic History: No LMP recorded.  Obstetric History: [id number]  Social History   Socioeconomic History  . Marital status: Married    Spouse name: Darnelle Maffucci  . Number of children: 2  . Years of education: Bachelors degree  . Highest education level: Not on file  Occupational History  . Not on file  Tobacco Use  . Smoking status: Never Smoker  . Smokeless tobacco: Never Used  Substance and Sexual Activity  . Alcohol use: Yes    Alcohol/week: 3.0 standard drinks    Types: 3 Shots of liquor per week    Comment: once a week  . Drug use: No  . Sexual activity: Yes    Birth control/protection: Surgical    Comment: husband had vasectomy done  Other Topics Concern  . Not on file  Social History Narrative   Lives with Husband Darnelle Maffucci   2 children - Alma Friendly and Landon   [place] (28)   Enjoys: reading, spending time with family, shopping, travelling    Exercise: video work-out - tries to do this once a week, needs to do better   Diet: tries to eat healthy   Social Determinants of Health   Financial Resource Strain: Low Risk   . Difficulty of Paying Living Expenses: Not hard at all  Food Insecurity:   . Worried About Charity fundraiser in the Last Year: Not on file  . Ran Out of Food in the Last Year: Not on file  Transportation Needs:   . Lack of Transportation (Medical): Not on file  . Lack of Transportation (Non-Medical): Not on file  Physical Activity:   . Days of Exercise per Week: Not on file  . Minutes of Exercise per Session: Not on file  Stress:   . Feeling of Stress : Not on file  Social Connections:   . Frequency of Communication with Friends and Family: Not on file  . Frequency of Social Gatherings with Friends and Family: Not on file  . Attends Religious Services: Not on file  . Active Member of Clubs or Organizations: Not on file  . Attends Archivist Meetings: Not on file  . Marital Status: Not on file  Intimate  Partner Violence:   . Fear of Current or Ex-Partner: Not on file  . Emotionally Abused: Not on file  . Physically Abused: Not on file  . Sexually Abused: Not on file    Family History  Problem Relation Age of Onset  . Mental illness Mother        anxiety  . Nephrolithiasis Mother   . Diabetes Mother   . Hypertension Mother   . Hearing loss Mother   . Hyperlipidemia Mother   . Diabetes Father   . Hyperlipidemia Father   . Hypertension Father   . Cancer Maternal  Grandmother 65       Pancreatic  . Diabetes Brother     Review of Systems  Constitutional: Negative for chills and fever.  HENT: Negative for congestion and sore throat.   Eyes: Negative for blurred vision and double vision.  Respiratory: Negative for shortness of breath.   Cardiovascular: Negative for chest pain.  Gastrointestinal: Positive for abdominal pain (hernia). Negative for heartburn, nausea and vomiting.  Genitourinary: Negative.   Musculoskeletal: Negative.  Negative for myalgias.  Skin: Negative for rash.  Neurological: Negative for dizziness and headaches.  Endo/Heme/Allergies: Does not bruise/bleed easily.  Psychiatric/Behavioral: Negative for depression. The patient is not nervous/anxious.      Physical Exam BP 100/68 (BP Location: Left Arm, Patient Position: Sitting, Cuff Size: Large)   Pulse 73   Temp (!) 97.1 F (36.2 C)   Ht 5' 4.25" (1.632 m)   Wt 156 lb (70.8 kg) Comment: per pt  SpO2 99%   BMI 26.57 kg/m    BP Readings from Last 3 Encounters:  05/14/19 100/68  05/12/18 98/60  10/28/14 (!) 109/55    Wt Readings from Last 3 Encounters:  05/14/19 156 lb (70.8 kg)  04/01/19 154 lb (69.9 kg)  05/12/18 159 lb 12 oz (72.5 kg)     Physical Exam Constitutional:      General: She is not in acute distress.    Appearance: She is well-developed. She is not diaphoretic.  HENT:     Head: Normocephalic and atraumatic.     Right Ear: External ear normal.     Left Ear: External ear  normal.     Nose: Nose normal.  Eyes:     General: No scleral icterus.    Conjunctiva/sclera: Conjunctivae normal.  Cardiovascular:     Rate and Rhythm: Normal rate and regular rhythm.     Heart sounds: No murmur.  Pulmonary:     Effort: Pulmonary effort is normal. No respiratory distress.     Breath sounds: Normal breath sounds. No wheezing.  Abdominal:     General: Bowel sounds are normal. There is no distension.     Palpations: Abdomen is soft. There is no mass.     Tenderness: There is generalized abdominal tenderness and tenderness in the periumbilical area. There is no guarding or rebound.     Hernia: A hernia is present. Hernia is present in the umbilical area.  Musculoskeletal:        General: Normal range of motion.     Cervical back: Neck supple.  Lymphadenopathy:     Cervical: No cervical adenopathy.  Skin:    General: Skin is warm and dry.     Capillary Refill: Capillary refill takes less than 2 seconds.  Neurological:     Mental Status: She is alert and oriented to person, place, and time.     Deep Tendon Reflexes: Reflexes normal.  Psychiatric:        Behavior: Behavior normal.       Results: Depression screen [facility] 2/9 05/12/2018 09/01/2014 02/15/2014  Decreased Interest 0 0 0  Down, Depressed, Hopeless 0 0 0  PHQ - 2 Score 0 0 0  Altered sleeping 0 - -  Tired, decreased energy 1 - -  Change in appetite 0 - -  Feeling bad or failure about yourself  0 - -  Trouble concentrating 0 - -  Moving slowly or fidgety/restless 0 - -  Suicidal thoughts 0 - -  PHQ-9 Score 1 - -  Difficult doing work/chores  Not difficult at all - -      Assessment: 38 y.o. [id number] female here for routine annual examination.  Plan: Problem List Items Addressed This Visit      Endocrine   Benign tumor of parathyroid gland    Was seeing endocrinology and told she may need surgery but reports normal MRI. No symptoms currently, would like a second opinion      Relevant Orders    Ambulatory referral to Endocrinology     Other   Umbilical hernia without obstruction and without gangrene    Surgery referral. Discussed that may also be a diastasis recti which is playing in. Does endorse some pain but no obstruction history.       Relevant Orders   Ambulatory referral to General Surgery    Other Visit Diagnoses    Annual physical exam    -  Primary   Family history of hyperlipidemia       Relevant Orders   Lipid Profile       Screening: -- Blood pressure screen normal -- cholesterol screening: will obtain -- Weight screening: normal -- Diabetes Screening: not due for screening -- Nutrition: normal - encouraged continued healthy diet   Psych -- Depression screening (PHQ-9):    Office Visit from 05/12/2018 in [place] at [facility]  PHQ-9 Total Score  1       Safety -- tobacco screening: not using -- alcohol screening:  low-risk usage. -- no evidence of domestic violence or intimate partner violence.  Cancer Screening -- pap smear not collected per ASCCP guidelines -- family history of breast cancer screening: done. not at high risk.   Immunizations -- flu vaccine up to date -- TDAP q10 years up to date  Advised regular vision, dental care, exercise, and healthy diet.   Lesleigh Noe

## 2019-07-03 ENCOUNTER — Other Ambulatory Visit: Payer: Self-pay

## 2019-07-07 ENCOUNTER — Ambulatory Visit: Payer: 59 | Admitting: Internal Medicine

## 2019-07-07 ENCOUNTER — Other Ambulatory Visit: Payer: Self-pay

## 2019-07-07 ENCOUNTER — Encounter: Payer: Self-pay | Admitting: Internal Medicine

## 2019-07-07 VITALS — BP 128/60 | HR 85 | Ht 64.25 in | Wt 163.0 lb

## 2019-07-07 DIAGNOSIS — E221 Hyperprolactinemia: Secondary | ICD-10-CM | POA: Diagnosis not present

## 2019-07-07 DIAGNOSIS — R7989 Other specified abnormal findings of blood chemistry: Secondary | ICD-10-CM | POA: Diagnosis not present

## 2019-07-07 DIAGNOSIS — D352 Benign neoplasm of pituitary gland: Secondary | ICD-10-CM | POA: Insufficient documentation

## 2019-07-07 LAB — TSH: TSH: 0.82 u[IU]/mL (ref 0.35–4.50)

## 2019-07-07 LAB — FOLLICLE STIMULATING HORMONE: FSH: 7.9 m[IU]/mL

## 2019-07-07 LAB — T3, FREE: T3, Free: 3.4 pg/mL (ref 2.3–4.2)

## 2019-07-07 LAB — T4, FREE: Free T4: 0.83 ng/dL (ref 0.60–1.60)

## 2019-07-07 LAB — LUTEINIZING HORMONE: LH: 7.24 m[IU]/mL

## 2019-07-07 LAB — CORTISOL: Cortisol, Plasma: 8.3 ug/dL

## 2019-07-07 NOTE — Progress Notes (Signed)
Patient ID: Nicole Clark, female   DOB: 28-Jun-1980, 39 y.o.   MRN: [id number]   This visit occurred during the SARS-CoV-2 public health emergency.  Safety protocols were in place, including screening questions prior to the visit, additional usage of staff PPE, and extensive cleaning of exam room while observing appropriate contact time as indicated for disinfecting solutions.   HPI  Nicole Clark is a 39 y.o.-year-old female, referred by her PCP, Dr. Einar Pheasant, for evaluation for pituitary adenoma.  She was previously seen by Dr. Chalmers Cater but would like a second opinion.  She has been found to have a goiter by Dr. Garwin Brothers in 2011 >> she was referred to an endocrinologist >> found to have  pituitary adenoma in 2011.  At that time, an 8 mm pituitary adenoma was found.  However, in 2017, she had another pituitary MRI and the adenoma was not visible anymore.  Her recent pituitary MRI from 08/05/2018 again showed a possible adenoma of 3 to 4 mm.  Reviewed the reports and images of her prior imaging studies: 01/25/2010: Pituitary MRI: Findings consistent with a roughly spherical 8 mm prolactinoma arising from the left side the pituitary gland.  The predominant signal characteristic of T1 shortening could indicate some subacute hemorrhage of the tumor,but also prolactinomas can have this appearance natively or if there has been previous treatment with Bromocryptine.  Correlate clinically.  At this time there is no critical compression of adjacent neural structures.  04/04/2016: Pituitary MRI: Nonvisualization of previously seen 4 mm microadenoma within the left aspect of the pituitary gland. The gland is normal in appearance on today's study, with no discrete lesion now identified.  08/05/2018: Pituitary MRI: 1. Subtle residual of the chronic left side pituitary microadenoma suspected, now only visible on dynamic images and measuring 3-4 mm. Mildly decreased overall pituitary volume since 2017. 2. Stable MRI  appearance of the brain since 2015 with mild nonspecific chronic cerebral white matter signal changes.  I reviewed Dr. Jana Hakim notes and it appears that patient also had a history of slightly elevated prolactin and IGF-I:  07/14/2018: GH 0.2, IGF1 234 (69-227), prolactin 25.1 (4.8-23.3), LH 6.7, FSH 8.8, estradiol 37.5  06/24/2018:  Normal 3-hour OGTT, but growth hormone results are not available to me  06/16/2018:  GH 0.6, IGF1 240 (69-227), prolactin 24.0 (4.8-23.3), ACTH 22.6, TSH 0.695  07/19/2017 : IGF-I 257 (73-243), prolactin 36.8  (4.8-23.3)  06/18/2017: IGF-I 275 (73-243), prolactin 39.7 (4.8-23.3)  Pt c/o: - + increased sweating - chronic - + headaches - no joint pains - no constipation - no increased in size of hands and feet or jaw - + weight gain (approximately 6 pounds) - no fatigue - + hair loss - no depression, + mild anxiety  She has an 8 and a 39 y/o. No pbs conceiving.  Her menstrual cycles are regular.  I reviewed pt's thyroid tests: Lab Results  Component Value Date   TSH 0.59 05/10/2017   FREET4 1.17 05/10/2017  06/16/2018: TPO antibodies 8 (0-34) 06/18/2017: TPO antibodies 9 (0-34)  Pt. also has a history of palpable thyroid but a thyroid U/S showed no nodules.  She had a slight acid reflux.  ROS: + See HPI and also: Constitutional: + Weight gain/loss, no fatigue, no subjective hyperthermia/hypothermia, + mildly poor sleep Eyes: no blurry vision, no xerophthalmia ENT: no sore throat, no dysphagia/odynophagia, no hoarseness Cardiovascular: no CP/SOB/palpitations/leg swelling Respiratory: no cough/SOB Gastrointestinal: no N/V/D/C Musculoskeletal: no muscle/joint aches Skin: no rashes, + hair loss Neurological:  no tremors/numbness/tingling/dizziness, + headache Psychiatric: no depression/+ anxiety  Past Medical History:  Diagnosis Date  . Benign tumor of parathyroid gland   . Complication of anesthesia    epidural in labor was ineffective   . Fibroids   . GERD (gastroesophageal reflux disease)   . Gestational diabetes 2016   diet controlled  . History of chicken pox    Past Surgical History:  Procedure Laterality Date  . CESAREAN SECTION  05/01/2011   Procedure: CESAREAN SECTION;  Surgeon: Margarette Asal;  Location: [facility];  Service: Gynecology;  Laterality: N/A;  Primary cesarean section with delivery of baby boy at 17. Apgars 9/9.  Marland Kitchen CESAREAN SECTION N/A 10/26/2014   Procedure: Repeat CESAREAN SECTION;  Surgeon: Servando Salina, MD;  Location: [facility];  Service: Obstetrics;  Laterality: N/A;  EDD: 11/01/14  . DILATION AND CURETTAGE OF UTERUS    . DILATION AND CURETTAGE OF UTERUS  09/2008  . WISDOM TOOTH EXTRACTION     Social History   Socioeconomic History  . Marital status: Married    Spouse name: Darnelle Maffucci  . Number of children: 2  . Years of education: Bachelors degree  . Highest education level: Not on file  Occupational History  .  Deputy [employer]  Tobacco Use  . Smoking status: Never Smoker  . Smokeless tobacco: Never Used  Substance and Sexual Activity  . Alcohol use: Yes    Alcohol/week:     Types:     Comment: wine, 1-2x a week  . Drug use: No  . Sexual activity: Yes    Birth control/protection: Surgical    Comment: husband had vasectomy done  Other Topics Concern  . Not on file  Social History Narrative   Lives with Husband Darnelle Maffucci   2 children - Alma Friendly and Landon   [place] (9)   Enjoys: reading, spending time with family, shopping, travelling    Exercise: video work-out - tries to do this once a week, needs to do better   Diet: tries to eat healthy   Social Determinants of Health   Financial Resource Strain:   . Difficulty of Paying Living Expenses: Not on file  Food Insecurity:   . Worried About Charity fundraiser in the Last Year: Not on file  . Ran Out of Food in the Last Year: Not on file  Transportation Needs:   . Lack of Transportation (Medical): Not on file  .  Lack of Transportation (Non-Medical): Not on file  Physical Activity:   . Days of Exercise per Week: Not on file  . Minutes of Exercise per Session: Not on file  Stress:   . Feeling of Stress : Not on file  Social Connections:   . Frequency of Communication with Friends and Family: Not on file  . Frequency of Social Gatherings with Friends and Family: Not on file  . Attends Religious Services: Not on file  . Active Member of Clubs or Organizations: Not on file  . Attends Archivist Meetings: Not on file  . Marital Status: Not on file  Intimate Partner Violence:   . Fear of Current or Ex-Partner: Not on file  . Emotionally Abused: Not on file  . Physically Abused: Not on file  . Sexually Abused: Not on file   Current Outpatient Medications on File Prior to Visit  Medication Sig Dispense Refill  . hydrOXYzine (VISTARIL) 25 MG capsule Take 1-2 capsules (25-50 mg total) by mouth 3 (three) times daily as  needed for anxiety. (Patient not taking: Reported on 07/07/2019) 60 capsule 1  . naproxen sodium (ALEVE) 220 MG tablet Take 220 mg by mouth daily as needed.    . Pediatric Multiple Vit-C-FA (FLINSTONES GUMMIES OMEGA-3 DHA PO) Take 1 tablet by mouth daily.     No current facility-administered medications on file prior to visit.   No Known Allergies Family History  Problem Relation Age of Onset  . Mental illness Mother        anxiety  . Nephrolithiasis Mother   . Diabetes Mother   . Hypertension Mother   . Hearing loss Mother   . Hyperlipidemia Mother   . Diabetes Father   . Hyperlipidemia Father   . Hypertension Father   . Cancer Maternal Grandmother 77       Pancreatic  . Diabetes Brother    PE: BP 128/60   Pulse 85   Ht 5' 4.25" (1.632 m)   Wt 163 lb (73.9 kg)   SpO2 97%   BMI 27.76 kg/m  Wt Readings from Last 3 Encounters:  07/07/19 163 lb (73.9 kg)  05/14/19 156 lb (70.8 kg)  04/01/19 154 lb (69.9 kg)   Constitutional: normal weight, in NAD Eyes:  PERRLA, EOMI, no exophthalmos ENT: moist mucous membranes, no thyromegaly, no cervical lymphadenopathy Cardiovascular: RRR, No MRG Respiratory: CTA B Gastrointestinal: abdomen soft, NT, ND, BS+ Musculoskeletal: no deformities, strength intact in all 4 Skin: moist, warm, no rashes Neurological: no tremor with outstretched hands, DTR normal in all 4  ASSESSMENT: 1. Pituitary microadenoma  2.  Elevated prolactin  3.  Elevated IGF-I  PLAN:  1., 2., 3. Patient with a history of pituitary microadenoma, found after investigation of her elevated prolactin.  She also had a slightly high IGF-I level per review of her records from her previous endocrinologist - I reviewed the latest pituitary MRI (08/2018) images and report along with the patient. Since the tumor is under 1 cm, this qualifies as a micro-, rather than a macro-adenoma.  This measures 3 to 4 mm, which is significantly decreased from the 8 mm dimensions on the 2011 MRI. She had another MRI in 2017 and a pituitary tumor not be seen on this imaging test, however, I suspect that the slices were too thick. - I explained that this is not a "brain tumor". These tumors are extremely rarely malignant, the vast majority of them are benign.  - A pituitary adenoma can be:  Not producing hormones, not compressing the pituitary gland or the optic chiasm  Not producing hormones, but compressing either the pituitary gland (causing hypopituitarism) or the optic chiasm (causing visual field cuts)  Producing hormones: - Prolactin (prolactinoma) - however, she has regular menstrual cycles and no breast milk discharge.  Her prolactin level is only slightly high and she had normal LH, FSH, estrogen.  It is possible that this is due to macroprolactinoma will check for this today.  I explained what this means. - ACTH (Cushing's disease) - she has minimum weight gain without central distribution of fat, wide, purple, stretch marks, full supraclavicular fat  pads, diabetes, hypertension.  ACTH level was normal at last check by previous endocrinologist and we will recheck an ACTH and cortisol level today. - growth hormone (acromegaly) - she denies an enlarged jaw, change in the size of rings or changing shoe size except increase by half a shoe size after the birth of her second child.  She had low growth hormone levels but we discussed that the  IGF-I is a more accurate labs to investigate this condition.  Macro-IGF-I is also possible but I cannot test for this today.  If she does have macroprolactin, IGF-I multimeric complexes are definitely a consideration.  We discussed that the size of her tumor and the IGF-I levels continue to decrease and this is an excellent sign.  As of now, her tumor is very small and her IGF-I is only slightly above the upper limit of normal.  I would be in favor to follow her for now with another MRI in 08/2019 and continuing to check her IGF-I for now.  I will also need to get the records of her previous OGTT, however, she is wondering whether this was accurate since she had to go to work and was very active in the morning of the test. - LH or FSH (gonadotropin secreting tumor) - her menstrual cycles are normal - TSH (TSH secreting tumor) (very rare) - TFTs were normal - as the first step in the investigation we will need to check the above hormones and to obtain the previous OGTT records from her previous endocrinologist - I ordered the following labs: Orders Placed This Encounter  Procedures  . Insulin-like growth factor  . ACTH  . Cortisol  . TSH  . T4, free  . T3, free  . Follicle stimulating hormone  . Luteinizing hormone  . Macroprolactin  . Growth hormone  -Although for now it is premature to discuss about pituitary surgery, I reviewed with her the options for this intervention:  Transsphenoidal nasal (most commonly used)  Transsphenoidal sublabial - under upper lip  Open craniotomy (not applicable for her for  such a small tumor) - we'll proceed with the above tests and will decide about further testing or treatment when the results are back Return in about 6 months  Component     Latest Ref Rng & Units 07/07/2019  Prolactin, Serum (ICMA)     ng/mL 37 (H)  Monomeric Prolactin (ICMA)     ng/mL 27 (H)  Percent Macroprolactin     % 27  IGF-I, LC/MS     53 - 331 ng/mL 196  Z-Score (Female)     -2.0 - 2 SD 0.7  C206 ACTH     6 - 50 pg/mL 18  Cortisol, Plasma     ug/dL 8.3  TSH     0.35 - 4.50 uIU/mL 0.82  T4,Free(Direct)     0.60 - 1.60 ng/dL 0.83  Triiodothyronine,Free,Serum     2.3 - 4.2 pg/mL 3.4  FSH     mIU/ML 7.9  LH     mIU/mL 7.24  Growth Hormone     < OR = 7.1 ng/mL 0.1   Labs all normal with exception of slightly elevated prolactin.  There is no increased proportion of macroprolactin (more than 50%), so it is possible that the tumor is producing a small amount of prolactin.  However, the level is just slightly above the upper limit of normal so no absolute intervention is needed for now especially with normal menstrual cycles and normal LH and FSH.  I would like to repeat your tests in 6 months.  Philemon Kingdom, MD PhD [facility]

## 2019-07-07 NOTE — Patient Instructions (Addendum)
Please stop at the lab.  Please come back for a follow-up appointment in 6 months.   Acromegaly Acromegaly is a hormone disorder that causes unusual growth of the body in adults. It commonly affects the bones of the hands, feet, and face. Over time, acromegaly can lead to a number of problems or other conditions, such as:  Diabetes mellitus.  Thyroid problems.  Colon polyps.  Osteoarthritis.  Heart disease.  Sleep apnea.  High blood pressure (hypertension). What are the causes? This condition can be caused when the body produces more growth hormone than it needs. This can result from:  A non-cancerous (benign) tumor on the gland that produces growth hormone (pituitary gland). This is a common cause.  A tumor in another part of the body, such as the adrenal gland or lungs. This is a rare cause. What are the signs or symptoms? Common signs and symptoms of this condition include:  Larger-than-normal hands, feet, head, face, brow, or jaw.  Swelling of the hands or feet.  Headaches.  Fatigue or weakness.  Enlarged heart, liver, kidneys, spleen, or other internal organs.  Severe problems with snoring or sleep apnea.  Blurred vision.  Joint pain.  Skin changes, such as skin tags or coarse skin.  Widely spaced teeth.  Numbness or tingling in the fingers.  Menstrual cycle changes in women.  Erectile dysfunction in men. How is this diagnosed? This condition may be diagnosed based on:  A physical exam.  Blood tests.  Imaging tests, such as an MRI or a CT scan. How is this treated? This condition may be treated with:  Surgery to remove the tumor that is causing the condition.  Medicines. Medicines may be given instead of surgery, or they may be given before surgery to shrink the tumor. They may also be given after surgery: ? If the surgery is not successful. ? If hormone levels do not return to normal.  Radiation therapy. This is used if surgery and  medicines are not effective. Follow these instructions at home:  Keep all follow-up visits as told by your health care provider. It is important to keep follow-up appointments all through your life so that your health care provider can: ? Check whether your pituitary gland is working normally. ? Look for complications of the disease.  Get eye exams regularly.  Take over-the-counter and prescription medicines only as told by your health care provider. Contact a health care provider if:  You have changes in your vision.  Your headaches get worse.  You have new symptoms or new pain or numbness.  Your symptoms do not get better with treatment. Get help right away if:  You have pain or pressure in your chest.  You have difficulty breathing.  You pass out or feel faint. Summary  Acromegaly is a hormone disorder that causes unusual growth of the body in adults. It commonly affects the bones of the hands, feet, and face.  Over time, acromegaly can lead to a number of problems or other conditions.  This condition can be caused when the body produces more growth hormone than it needs.  Acromegaly is most commonly caused by a non-cancerous (benign) tumor on the gland that produces growth hormone (pituitary gland).  This condition may be treated with surgery to remove a tumor, medicines, or radiation. This information is not intended to replace advice given to you by your health care provider. Make sure you discuss any questions you have with your health care provider. Document Revised: 05/03/2017  Document Reviewed: 01/03/2017 Elsevier Patient Education  [place] [facility] Why am I having this test? The insulin-like growth factor-1 (IGF-1) test is used to help diagnose the cause of abnormal patterns of growth or other conditions that cause low levels of growth hormone (GH) in the body. IGF-1 levels are low when GH levels are low. Therefore, this  test can be used to predict GH levels in the body. Also, because IGF-1 blood levels are more stable throughout the day than GH blood levels are, the IGF-1 test is considered to be a more reliable test. The IGF-1 test may also be done to see how well treatments with [facility] are working or to check the function of your pituitary gland. The pituitary gland produces GH and many other hormones in your body. Testing of pituitary gland function may be needed for people with certain signs or symptoms, including:  Children with these signs or symptoms: ? Slow growth or delayed development over a period of time when compared to normal growth expectations. ? Being shorter than expected for their age, sex, or family history (dwarfism). ? Being larger in stature and height than expected for their age, sex, or family history (gigantism).  Adults with these signs or symptoms: ? Decreased bone density, decreased muscle strength, and high levels of blood fat (lipids) that are not explained by other factors. ? Signs of acromegaly, such as having fingers, toes, jaw, ears, and nose grow out of proportion to the rest of the body. What is being tested? This test measures the IGF-1 levels in your blood to help determine how much growth hormone your body is producing. What kind of sample is taken?  A blood sample is required for this test. It is usually collected by inserting a needle into a blood vessel. How do I prepare for this test?  Do not eat or drink anything after midnight on the night before the test or as told by your health care provider.  Tell your health care provider if you have had estrogen treatment. This may decrease your IGF-1 levels. How are the results reported? Your test results will be reported as ranges. Your health care provider will compare your results to normal ranges that were established after testing a large group of people (reference ranges). Reference ranges may vary among labs and  hospitals. For this test, common reference ranges are:  Adult: 42-110 ng/mL.  Child, Female: ? 69-35 years old: 5-128 ng/mL. ? 67-32 years old: 24-158 ng/mL. ? 21-49 years old: 65-226 ng/mL. ? 51-20 years old: 124-242 ng/mL. ? 65-50 years old: 94-231 ng/mL. ? 46-68 years old: 66-186 ng/mL.  Child, Female: ? 75-26 years old: 2-118 ng/mL. ? 10-46 years old: 15-148 ng/mL. ? 50-1 years old: 55-216 ng/mL. ? 42-70 years old: 114-232 ng/mL. ? 60-93 years old: 84-211 ng/mL. ? 76-33 years old: 56-177 ng/mL. What do the results mean? Results within the reference range are considered normal. A test result that is higher than the reference range may indicate various conditions, such as:  Gigantism.  Acromegaly.  Being exposed to high levels of stress.  Recent major surgery.  Low blood sugar (hypoglycemia).  Starvation.  Deep-sleep state at the time of testing.  Recent exercise. A test result that is lower than the reference range may indicate:  Abnormally low levels (deficiency) of GH.  Abnormally low pituitary gland function.  Pituitary tumors.  Dwarfism.  High blood sugar (hyperglycemia).  Failure to thrive.  Delayed sexual  maturity in adolescents.  Malnutrition.  Malabsorption of nutrients from the digestive tract.  Anorexia nervosa.  Severe liver disease.  Underactive thyroid gland (hypothyroidism). Talk with your health care provider about what your results mean. Questions to ask your health care provider Ask your health care provider, or the department that is doing the test:  When will my results be ready?  How will I get my results?  What are my treatment options?  What other tests do I need?  What are my next steps? Summary  The insulin-like growth factor-1 (IGF-1) test is used to help diagnose the cause of abnormal patterns of growth or other conditions that cause low levels of growth hormone (GH) in the body.  The IGF-1 test may also be done to  see how well treatments with [facility] are working or to check the function of your pituitary gland.  This test measures the IGF-1 levels in your blood to help determine how much growth hormone your body is producing.  Talk with your health care provider about what your results mean. This information is not intended to replace advice given to you by your health care provider. Make sure you discuss any questions you have with your health care provider. Document Revised: 01/10/2017 Document Reviewed: 01/10/2017 Elsevier Patient Education  [place].

## 2019-07-12 LAB — INSULIN-LIKE GROWTH FACTOR
IGF-I, LC/MS: 196 ng/mL (ref 53–331)
Z-Score (Female): 0.7 SD (ref ?–2.0)

## 2019-07-12 LAB — EXTRA SPECIMEN

## 2019-07-12 LAB — GROWTH HORMONE: Growth Hormone: 0.1 ng/mL (ref <=7.1)

## 2019-07-12 LAB — ACTH: C206 ACTH: 18 pg/mL (ref 6–50)

## 2019-07-17 ENCOUNTER — Encounter: Payer: Self-pay | Admitting: Internal Medicine

## 2019-07-17 LAB — MACROPROLACTIN
Monomeric Prolactin (ICMA): 27 ng/mL — ABNORMAL HIGH
Percent Macroprolactin: 27 %
Prolactin, Serum (ICMA): 37 ng/mL — ABNORMAL HIGH

## 2019-07-20 ENCOUNTER — Encounter: Payer: Self-pay | Admitting: Internal Medicine

## 2019-08-10 ENCOUNTER — Encounter: Payer: Self-pay | Admitting: Internal Medicine

## 2019-08-10 ENCOUNTER — Encounter: Payer: Self-pay | Admitting: Family Medicine

## 2019-10-16 ENCOUNTER — Telehealth: Payer: Self-pay

## 2019-10-16 NOTE — Telephone Encounter (Signed)
05-14-2019 General Surgery referral closed. See Referral Notes/hx.  Pt has not responded to phone calls.

## 2020-01-05 ENCOUNTER — Ambulatory Visit: Payer: 59 | Admitting: Internal Medicine

## 2020-01-19 ENCOUNTER — Ambulatory Visit: Payer: 59 | Admitting: Internal Medicine

## 2020-01-19 DIAGNOSIS — Z5329 Procedure and treatment not carried out because of patient's decision for other reasons: Secondary | ICD-10-CM

## 2020-01-19 NOTE — Progress Notes (Signed)
Patient ID: Nicole Clark, female   DOB: 10/14/1980, 39 y.o.   MRN: [id number]   Canceled right before appointment time.  HPI  Nicole Clark is a 39 y.o.-year-old female, initially referred by her PCP, Dr. Einar Pheasant, returning for follow-up for pituitary adenoma.  She was previously seen by Dr. Chalmers Cater and came to see me for a 2nd opinion. Her 1st visit was 6 months ago.  Patient has been found to have a goiter by Dr. Garwin Brothers in 2011. She was referred to see an endocrinologist at that time. She was found to have an 8 mm pituitary adenoma. She had another pituitary MRI in 2017 and the adenoma was not visible on this study. However, on the most recent pituitary MRI from 08/05/2018, a pituitary adenoma was again seen, but much smaller, 3 to 4 mm.  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  The predominant signal characteristic of T1 shortening could indicate some subacute hemorrhage of the tumor,but also prolactinomas can have this appearance natively or if there has been previous treatment with Bromocryptine.  Correlate clinically.  At this time there is no critical compression of adjacent neural structures.  04/04/2016: Pituitary MRI: Nonvisualization of previously seen 4 mm microadenoma within the left aspect of the pituitary gland. The gland is normal in appearance on today's study, with no discrete lesion now identified.  08/05/2018: Pituitary MRI: 1. Subtle residual of the chronic left side pituitary microadenoma suspected, now only visible on dynamic images and measuring 3-4 mm. Mildly decreased overall pituitary volume since 2017. 2. Stable MRI appearance of the brain since 2015 with mild nonspecific chronic cerebral white matter signal changes.  I reviewed Dr. Jana Hakim notes and it appears that patient also had a history of slightly elevated  prolactin and IGF-I:  07/14/2018: GH 0.2, IGF1 234 (69-227), prolactin 25.1 (4.8-23.3), LH 6.7, FSH 8.8, estradiol 37.5  06/24/2018:  Normal 3-hour OGTT, but growth hormone results are not available to me  06/16/2018:  GH 0.6, IGF1 240 (69-227), prolactin 24.0 (4.8-23.3), ACTH 22.6, TSH 0.695  07/19/2017 : IGF-I 257 (73-243), prolactin 36.8  (4.8-23.3)  06/18/2017: IGF-I 275 (73-243), prolactin 39.7 (4.8-23.3)  However, at last visit, her IGF-I and growth hormone levels were normal, as were the rest of her pituitary labs except for a slightly elevated monomeric prolactin:  Component     Latest Ref Rng & Units 07/07/2019  Prolactin, Serum (ICMA)     ng/mL 37 (H)  Monomeric Prolactin (ICMA)     ng/mL 27 (H)  Percent Macroprolactin     % 27  IGF-I, LC/MS     53 - 331 ng/mL 196  Z-Score (Female)     -2.0 - 2 SD 0.7  C206 ACTH     6 - 50 pg/mL 18  Cortisol, Plasma     ug/dL 8.3  TSH     0.35 - 4.50 uIU/mL 0.82  T4,Free(Direct)     0.60 - 1.60 ng/dL 0.83  Triiodothyronine,Free,Serum     2.3 - 4.2 pg/mL 3.4  FSH     mIU/ML 7.9  LH     mIU/mL 7.24  Growth Hormone     < OR = 7.1 ng/mL 0.1   At last visit, she described chronic increased sweating, headaches, some weight gain (6 pounds), hair loss.  At this visit, **  She has two children: 9 and 5  y/o. She had no problems conceiving. Her menstrual cycles are regular.  I reviewed patient's TFTs: Lab Results  Component Value Date   TSH 0.82 07/07/2019   TSH 0.59 05/10/2017   FREET4 0.83 07/07/2019   FREET4 1.17 05/10/2017  06/16/2018: TPO antibodies 8 (0-34) 06/18/2017: TPO antibodies 9 (0-34)  She also has a history of a palpable thyroid but the thyroid ultrasound showed no nodules.  Pt denies: - feeling nodules in neck - hoarseness - dysphagia - choking - SOB with lying down  ROS: + See HPI + Constitutional: no weight gain/no weight loss, no fatigue, no subjective hyperthermia, no subjective hypothermia Eyes: no  blurry vision, no xerophthalmia ENT: no sore throat, + see HPI Cardiovascular: no CP/no SOB/no palpitations/no leg swelling Respiratory: no cough/no SOB/no wheezing Gastrointestinal: no N/no V/no D/no C/no acid reflux Musculoskeletal: no muscle aches/no joint aches Skin: no rashes, + hair loss Neurological: no tremors/no numbness/no tingling/no dizziness, + HA  I reviewed pt's medications, allergies, PMH, social hx, family hx, and changes were documented in the history of present illness. Otherwise, unchanged from my initial visit note.   PE: There were no vitals taken for this visit. Wt Readings from Last 3 Encounters:  07/07/19 163 lb (73.9 kg)  05/14/19 156 lb (70.8 kg)  04/01/19 154 lb (69.9 kg)   Constitutional: normal weight, in NAD Eyes: PERRLA, EOMI, no exophthalmos ENT: moist mucous membranes, no thyromegaly, no cervical lymphadenopathy Cardiovascular: RRR, No MRG Respiratory: CTA B Gastrointestinal: abdomen soft, NT, ND, BS+ Musculoskeletal: no deformities, strength intact in all 4 Skin: moist, warm, no rashes Neurological: no tremor with outstretched hands, DTR normal in all 4  ASSESSMENT: 1. Pituitary microadenoma  2.  Elevated prolactin  3.  Elevated IGF-I - resolved  4. Palpable thyroid  PLAN:  1., 2., 3. Patient with a history of pituitary microadenoma, found after investigation of her elevated prolactin. In the past, she also had a slightly elevated IGF-I per review of the records from her previous endocrinologist. However, pituitary labs were normal at last visit except for a slightly high prolactin. Both IGF-I and growth hormone were well within the normal range. To investigate her elevated prolactin, we checked her for macro prolactin and the investigation was negative. She had a slightly elevated monomeric prolactin, which is probably secreted by her pituitary tumor.  -We reviewed together her previous imaging studies. She initially had a 8 mm pituitary  tumor in 2011, which was not seen on an MRI in 2017 (probably due to thicker MRI slices) but was again seen in 2020 at the months smaller size, between three and 4 mm. I explained to the patient this is a significant decrease in size, of approximately 50%, which is excellent. -Since her prolactin level was only slightly high at last visit and the rest of the pituitary hormones were normal, especially in the setting of normal menstrual cycles and lack of galactorrhea, we decided to just follow her prolactin level without intervention -At today's visit, we'll repeat her prolactin level and an IGF-I, but I do not feel that further investigation of her pituitary function is needed. Of note, she does not have signs or symptoms of acromegaly: She denies change in size of her rings or change in shoe size except for increase by half her shoe size after the birth of her 2nd child. Also, she does not have an enlarged jaw. She does describe increased sweating, which is chronic for her. She had an OGTT in the past which  was normal, but I did not have the records of her growth hormone. If her IGF-I level remained normal, I do not feel that further investigation for this is necessary. -We discussed that if her prolactin starts to increase, she may need another MRI, but for now, I do not feel that we need this -We also discussed about possible treatment of hyperprolactinemia if her prolactin starts to increase: Usually with Cabergoline once or twice a week, and rarely, if cabergoline is not tolerated, with bromocriptine. -I will be in touch with the patient regarding her results -I will see her back in a year  4. Palpable thyroid -This is not felt at today's exam -She denies neck compression symptoms -Previous thyroid ultrasound did not show any nodules -TFTs were normal at last check -No further investigation is needed for this unless she starts developing neck compression symptoms.  Philemon Kingdom, MD  PhD [facility] Endocrin[facility]

## 2020-01-20 ENCOUNTER — Encounter: Payer: Self-pay | Admitting: Internal Medicine

## 2020-02-22 ENCOUNTER — Ambulatory Visit: Payer: 59 | Admitting: Internal Medicine

## 2020-02-22 ENCOUNTER — Other Ambulatory Visit: Payer: Self-pay

## 2020-02-22 ENCOUNTER — Other Ambulatory Visit: Payer: 59

## 2020-02-22 ENCOUNTER — Encounter: Payer: Self-pay | Admitting: Internal Medicine

## 2020-02-22 VITALS — BP 128/80 | HR 77 | Ht 64.25 in | Wt 164.0 lb

## 2020-02-22 DIAGNOSIS — D352 Benign neoplasm of pituitary gland: Secondary | ICD-10-CM | POA: Diagnosis not present

## 2020-02-22 DIAGNOSIS — R7989 Other specified abnormal findings of blood chemistry: Secondary | ICD-10-CM

## 2020-02-22 DIAGNOSIS — E221 Hyperprolactinemia: Secondary | ICD-10-CM | POA: Diagnosis not present

## 2020-02-22 NOTE — Patient Instructions (Addendum)
Please stop at the lab.  Please come back for a follow-up appointment in 1 year.  

## 2020-02-22 NOTE — Progress Notes (Deleted)
Patient ID: Nicole Clark, female   DOB: Jul 10, 1980, 39 y.o.   MRN: [id number]   This visit occurred during the SARS-CoV-2 public health emergency.  Safety protocols were in place, including screening questions prior to the visit, additional usage of staff PPE, and extensive cleaning of exam room while observing appropriate contact time as indicated for disinfecting solutions.   HPI  Nicole Clark is a 39 y.o.-year-old female, initially referred by her PCP, Dr. Einar Pheasant, returning for follow-up for pituitary adenoma.  She was previously seen by Dr. Chalmers Cater and came to see me for a 2nd opinion.  Last visit 7 months ago.  Patient was found to have a goiter by Dr. Garwin Brothers in 2011.  She was referred to see endocrinology at that time.  She was found to have an 8 mm pituitary adenoma.  She had another pituitary MRI in 2017 and the adenoma was not visible on this study.  However, the most recent pituitary MRI from 08/05/2018 showed a pituitary adenoma, but much smaller, 3 to 4 mm.  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  The predominant signal characteristic of T1 shortening could indicate some subacute hemorrhage of the tumor,but also prolactinomas can have this appearance natively or if there has been previous treatment with Bromocryptine.  Correlate clinically.  At this time there is no critical compression of adjacent neural structures.  04/04/2016: Pituitary MRI: Nonvisualization of previously seen 4 mm microadenoma within the left aspect of the pituitary gland. The gland is normal in appearance on today's study, with no discrete lesion now identified.  08/05/2018: Pituitary MRI: 1. Subtle residual of the chronic left side pituitary microadenoma suspected, now only visible on dynamic images and measuring 3-4 mm. Mildly decreased overall pituitary volume  since 2017. 2. Stable MRI appearance of the brain since 2015 with mild nonspecific chronic cerebral white matter signal changes.  I reviewed Dr. Almetta Lovely notes and it appears that patient also had a history of slightly elevated prolactin and IGF-I:  07/14/2018: GH 0.2, IGF1 234 (69-227), prolactin 25.1 (4.8-23.3), LH 6.7, FSH 8.8, estradiol 37.5  06/24/2018:  Normal 3-hour OGTT, but growth hormone results are not available to me  06/16/2018:  GH 0.6, IGF1 240 (69-227), prolactin 24.0 (4.8-23.3), ACTH 22.6, TSH 0.695  07/19/2017 : IGF-I 257 (73-243), prolactin 36.8  (4.8-23.3)  06/18/2017: IGF-I 275 (73-243), prolactin 39.7 (4.8-23.3)  However, at last visit, her IGF-I and growth hormone levels were normal, as were the rest of her pituitary labs except for slightly elevated monomeric prolactin: Component     Latest Ref Rng & Units 07/07/2019  Prolactin, Serum (ICMA)     ng/mL 37 (H)  Monomeric Prolactin (ICMA)     ng/mL 27 (H)  Percent Macroprolactin     % 27  IGF-I, LC/MS     53 - 331 ng/mL 196  Z-Score (Female)     -2.0 - 2 SD 0.7  C206 ACTH     6 - 50 pg/mL 18  Cortisol, Plasma     ug/dL 8.3  TSH     0.35 - 4.50 uIU/mL 0.82  T4,Free(Direct)     0.60 - 1.60 ng/dL 0.83  Triiodothyronine,Free,Serum     2.3 - 4.2 pg/mL 3.4  FSH     mIU/ML 7.9  LH     mIU/mL 7.24  Growth Hormone     < OR =  7.1 ng/mL 0.1   At last visit, she described chronic increased sweating, headaches, some weight gain (6 pounds), hair loss.  At this visit, ***.  She has 2 children.  She had no problems conceiving.  Her menstrual cycles are regular.  Reviewed her TFTs: Lab Results  Component Value Date   TSH 0.82 07/07/2019   TSH 0.59 05/10/2017   FREET4 0.83 07/07/2019   FREET4 1.17 05/10/2017  06/16/2018: TPO antibodies 8 (0-34) 06/18/2017: TPO antibodies 9 (0-34)  She has a history of palpable thyroid but the thyroid ultrasound showed no nodules.  Pt denies: - feeling nodules in neck -  hoarseness - dysphagia - choking - SOB with lying down  ROS: + See HPI Constitutional: no weight gain/no weight loss, no fatigue, no subjective hyperthermia, no subjective hypothermia Eyes: no blurry vision, no xerophthalmia ENT: no sore throat, + see HPI Cardiovascular: no CP/no SOB/no palpitations/no leg swelling Respiratory: no cough/no SOB/no wheezing Gastrointestinal: no N/no V/no D/no C/no acid reflux Musculoskeletal: no muscle aches/no joint aches Skin: no rashes, + hair loss Neurological: no tremors/no numbness/no tingling/no dizziness, + headaches  I reviewed pt's medications, allergies, PMH, social hx, family hx, and changes were documented in the history of present illness. Otherwise, unchanged from my initial visit note.  PE: There were no vitals taken for this visit. Wt Readings from Last 3 Encounters:  07/07/19 163 lb (73.9 kg)  05/14/19 156 lb (70.8 kg)  04/01/19 154 lb (69.9 kg)   Constitutional: normal weight, in NAD Eyes: PERRLA, EOMI, no exophthalmos ENT: moist mucous membranes, no thyromegaly, no cervical lymphadenopathy Cardiovascular: RRR, No MRG Respiratory: CTA B Gastrointestinal: abdomen soft, NT, ND, BS+ Musculoskeletal: no deformities, strength intact in all 4 Skin: moist, warm, no rashes Neurological: no tremor with outstretched hands, DTR normal in all 4  ASSESSMENT: 1. Pituitary microadenoma  2.  Elevated prolactin  3.  Elevated IGF-I -Resolved  4. Palpable thyroid  PLAN:  1., 2., 3.  Patient with a history of pituitary microadenoma, found during investigation for elevated prolactin.  In the past, she also had a slightly elevated IGF-I per review of the records from her previous endocrinologist.  However, pituitary labs were normal at last visit except for a slightly high prolactin.  Both IGF-I and growth hormone were well within the normal range.  To investigate her elevated prolactin, we checked her for macro prolactin and the  investigation was negative.  She had slightly elevated monomeric prolactin, which is probably secreted by her pituitary tumor. -We reviewed together the reports of her previous imaging studies.  She initially had an 8 mm pituitary tumor in 2011, which was not seen on an MRI from 2017 (probably due to thicker MRI slices), but was again seen in 2020 and this was small in size, between 3-4 mm.  I explained to the patient that this is a significant decrease in size, of approximately 50%, which is excellent. -Since her prolactin level was only slightly high at last visit and the rest of the pituitary hormones were normal, especially in the setting of normal menstrual cycles and lack of galactorrhea, we decided to just follow her prolactin level without intervention. -At today's visit, we will repeat her prolactin level and an IGF-I, but I do not feel that further investigation of her pituitary function is needed.  Of note, she does not have signs or symptoms of acromegaly: She denies change in size of her rings or change in shoe size except for increase by half  her shoe size after the birth of her second child.  Also, she does not have an enlarged jaw.  She does describe increased sweating, which is chronic for her.  She had an OGTT in the past which was normal, but I do not have the records of her growth hormone.  If her IGF-I levels remain normal, I do not feel that further investigation for this is necessary. -We also discussed that if her prolactin starts to increase, we may need to repeat her MRI, but for now, I do not feel that we need this -We also discussed about possible treatment of her hyperprolactinemia if her prolactin starts to increase: Usually with Cabergoline once or twice a week, and rarely, if cabergoline is not tolerated, with bromocriptine. -I will be in touch with the patient regarding her results -I we will see her back in a year  4. Palpable thyroid -This was not felt at today's  exam -No neck compression symptoms -Previously thyroid ultrasound did not show any nodules -TFTs were normal at last check -No further investigation is needed unless she starts developing neck compression symptoms  Philemon Kingdom, MD PhD [facility]

## 2020-02-22 NOTE — Progress Notes (Addendum)
Patient ID: Nicole Clark, female   DOB: 1981-01-17, 39 y.o.   MRN: [id number]   This visit occurred during the SARS-CoV-2 public health emergency.  Safety protocols were in place, including screening questions prior to the visit, additional usage of staff PPE, and extensive cleaning of exam room while observing appropriate contact time as indicated for disinfecting solutions.   HPI  Nicole Clark is a 39 y.o.-year-old female, initially referred by her PCP, Dr. Einar Pheasant, returning for follow-up for pituitary adenoma.  She was previously seen by Dr. Chalmers Cater and came to see me for a 2nd opinion.  Last visit 7 months ago.  She just had Covid19 >> recovered, but still has some shortness of breath with rest and exertion.  Also, she did not recover her taste and smell.  Patient was found to have a goiter by Dr. Garwin Brothers in 2011.  She was referred to see endocrinology at that time. She was found to have an 8 mm pituitary adenoma.  She had another pituitary MRI in 2017 and the adenoma was not visible on the study.  However, the most recent pituitary MRI from 08/05/2018 showed a pituitary adenoma, but much smaller, 3-4 mm.  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  The predominant signal characteristic of T1 shortening could indicate some subacute hemorrhage of the tumor,but also prolactinomas can have this appearance natively or if there has been previous treatment with Bromocryptine.  Correlate clinically.  At this time there is no critical compression of adjacent neural structures.  04/04/2016: Pituitary MRI: Nonvisualization of previously seen 4 mm microadenoma within the left aspect of the pituitary gland. The gland is normal in appearance on today's study, with no discrete lesion now identified.  08/05/2018: Pituitary MRI: 1. Subtle residual of the chronic left  side pituitary microadenoma suspected, now only visible on dynamic images and measuring 3-4 mm. Mildly decreased overall pituitary volume since 2017. 2. Stable MRI appearance of the brain since 2015 with mild nonspecific chronic cerebral white matter signal changes.  I reviewed Dr. Almetta Lovely notes and it appears that patient also had a history of slightly elevated IGF-I and prolactin:  07/14/2018: GH 0.2, IGF1 234 (69-227), prolactin 25.1 (4.8-23.3), LH 6.7, FSH 8.8, estradiol 37.5  06/24/2018:  Normal 3-hour OGTT, but growth hormone results are not available to me  06/16/2018:  GH 0.6, IGF1 240 (69-227), prolactin 24.0 (4.8-23.3), ACTH 22.6, TSH 0.695  07/19/2017 : IGF-I 257 (73-243), prolactin 36.8  (4.8-23.3)  06/18/2017: IGF-I 275 (73-243), prolactin 39.7 (4.8-23.3)  However, at last visit her IGF-I and growth hormone levels are normal, as were the rest of her pituitary labs except for slightly elevated monomeric prolactin Component     Latest Ref Rng & Units 07/07/2019  Prolactin, Serum (ICMA)     ng/mL 37 (H)  Monomeric Prolactin (ICMA)     ng/mL 27 (H)  Percent Macroprolactin     % 27  IGF-I, LC/MS     53 - 331 ng/mL 196  Z-Score (Female)     -2.0 - 2 SD 0.7  C206 ACTH     6 - 50 pg/mL 18  Cortisol, Plasma     ug/dL 8.3  TSH     0.35 - 4.50 uIU/mL 0.82  T4,Free(Direct)     0.60 - 1.60 ng/dL 0.83  Triiodothyronine,Free,Serum     2.3 - 4.2 pg/mL 3.4  FSH     mIU/ML 7.9  LH     mIU/mL 7.24  Growth Hormone     < OR = 7.1 ng/mL 0.1   At last visit she described chronic increased sweating, headaches, some weight gain (6 pounds), hair loss.  At this visit, she still has some HAs, postmenstrual.   She has 2 children.  She had no problems conceiving.  Her menstrual cycles are regular.  Reviewed her TFTs: Lab Results  Component Value Date   TSH 0.82 07/07/2019   TSH 0.59 05/10/2017   FREET4 0.83 07/07/2019   FREET4 1.17 05/10/2017  06/16/2018: TPO antibodies 8  (0-34) 06/18/2017: TPO antibodies 9 (0-34)  She has a history of palpable thyroid but the thyroid ultrasound showed no nodules.  Pt denies: - feeling nodules in neck - hoarseness - dysphagia - choking - SOB with lying down  ROS: + See HPI Constitutional: no weight gain/no weight loss, no fatigue, no subjective hyperthermia, no subjective hypothermia Eyes: no blurry vision, no xerophthalmia ENT: no sore throat, + see HPI Cardiovascular: no CP/no SOB/no palpitations/no leg swelling Respiratory: no cough/no SOB/no wheezing Gastrointestinal: no N/no V/no D/no C/no acid reflux Musculoskeletal: no muscle aches/no joint aches Skin: no rashes, + hair loss Neurological: no tremors/no numbness/no tingling/no dizziness, + headaches  I reviewed pt's medications, allergies, PMH, social hx, family hx, and changes were documented in the history of present illness. Otherwise, unchanged from my initial visit note.  Past Medical History:  Diagnosis Date  . Benign tumor of parathyroid gland   . Complication of anesthesia    epidural in labor was ineffective  . Fibroids   . GERD (gastroesophageal reflux disease)   . Gestational diabetes 2016   diet controlled  . History of chicken pox    Past Surgical History:  Procedure Laterality Date  . CESAREAN SECTION  05/01/2011   Procedure: CESAREAN SECTION;  Surgeon: Margarette Asal;  Location: [facility];  Service: Gynecology;  Laterality: N/A;  Primary cesarean section with delivery of baby boy at 34. Apgars 9/9.  Marland Kitchen CESAREAN SECTION N/A 10/26/2014   Procedure: Repeat CESAREAN SECTION;  Surgeon: Servando Salina, MD;  Location: [facility];  Service: Obstetrics;  Laterality: N/A;  EDD: 11/01/14  . DILATION AND CURETTAGE OF UTERUS    . DILATION AND CURETTAGE OF UTERUS  09/2008  . WISDOM TOOTH EXTRACTION     Social History   Socioeconomic History  . Marital status: Married    Spouse name: Darnelle Maffucci  . Number of children: 2  . Years of education:  Bachelors degree  . Highest education level: Not on file  Occupational History  . Not on file  Tobacco Use  . Smoking status: Never Smoker  . Smokeless tobacco: Never Used  Vaping Use  . Vaping Use: Never used  Substance and Sexual Activity  . Alcohol use: Yes    Alcohol/week: 3.0 standard drinks    Types: 3 Shots of liquor per week    Comment: once a week  . Drug use: No  . Sexual activity: Yes    Birth control/protection: Surgical    Comment: husband had vasectomy done  Other Topics Concern  . Not on file  Social History Narrative   Lives with Husband Darnelle Maffucci   2 children - [place] and [place] (66)   Enjoys: reading, spending time with family, shopping, travelling    Exercise: video work-out - tries to do this once a week, needs to do  better   Diet: tries to eat healthy   Social Determinants of Health   Financial Resource Strain:   . Difficulty of Paying Living Expenses: Not on file  Food Insecurity:   . Worried About Charity fundraiser in the Last Year: Not on file  . Ran Out of Food in the Last Year: Not on file  Transportation Needs:   . Lack of Transportation (Medical): Not on file  . Lack of Transportation (Non-Medical): Not on file  Physical Activity:   . Days of Exercise per Week: Not on file  . Minutes of Exercise per Session: Not on file  Stress:   . Feeling of Stress : Not on file  Social Connections:   . Frequency of Communication with Friends and Family: Not on file  . Frequency of Social Gatherings with Friends and Family: Not on file  . Attends Religious Services: Not on file  . Active Member of Clubs or Organizations: Not on file  . Attends Archivist Meetings: Not on file  . Marital Status: Not on file  Intimate Partner Violence:   . Fear of Current or Ex-Partner: Not on file  . Emotionally Abused: Not on file  . Physically Abused: Not on file  . Sexually Abused: Not on file   Current Outpatient Medications on File  Prior to Visit  Medication Sig Dispense Refill  . hydrOXYzine (VISTARIL) 25 MG capsule Take 1-2 capsules (25-50 mg total) by mouth 3 (three) times daily as needed for anxiety. (Patient not taking: Reported on 07/07/2019) 60 capsule 1  . naproxen sodium (ALEVE) 220 MG tablet Take 220 mg by mouth daily as needed.    . Pediatric Multiple Vit-C-FA (FLINSTONES GUMMIES OMEGA-3 DHA PO) Take 1 tablet by mouth daily.     No current facility-administered medications on file prior to visit.   No Known Allergies Family History  Problem Relation Age of Onset  . Mental illness Mother        anxiety  . Nephrolithiasis Mother   . Diabetes Mother   . Hypertension Mother   . Hearing loss Mother   . Hyperlipidemia Mother   . Diabetes Father   . Hyperlipidemia Father   . Hypertension Father   . Cancer Maternal Grandmother 53       Pancreatic  . Diabetes Brother     PE: BP 128/80   Pulse 77   Ht 5' 4.25" (1.632 m)   Wt 164 lb (74.4 kg)   SpO2 97%   BMI 27.93 kg/m  Wt Readings from Last 3 Encounters:  02/22/20 164 lb (74.4 kg)  07/07/19 163 lb (73.9 kg)  05/14/19 156 lb (70.8 kg)   Constitutional: normal weight, in NAD Eyes: PERRLA, EOMI, no exophthalmos ENT: moist mucous membranes, no thyromegaly, no cervical lymphadenopathy Cardiovascular: RRR, No MRG Respiratory: CTA B Gastrointestinal: abdomen soft, NT, ND, BS+ Musculoskeletal: no deformities, strength intact in all 4 Skin: moist, warm, no rashes Neurological: no tremor with outstretched hands, DTR normal in all 4  ASSESSMENT: 1. Pituitary microadenoma  2.  Elevated prolactin  3.  Elevated IGF-I -Resolved  4. Palpable thyroid  PLAN:  1., 2., 3.  Patient with a history of pituitary microadenoma, found during investigation for elevated prolactin.  In the past, she also had a slightly elevated IGF-I per review of the records from her previous endocrinologist.  However, pituitary labs were normal at last visit except for a  slightly high prolactin.  Both IGF-I and growth hormone levels  were well within the normal range.  To investigate her elevated prolactin, we checked her for macro prolactin and the investigation was negative.  She had slightly elevated monomeric prolactin, which is probably secreted by her pituitary tumor. -We reviewed together the report of her previous imaging studies.  She initially had an 8 mm pituitary tumor in 2011, which was not seen on the MRI from 2017 (probably due to the thicker MRI slices) but it was seen again in 2020 and this was small in size, between 3 and 4 mm.  I explained to the patient that this is a significant increase in size, of approximately 50%, which is excellent. -Since her prolactin level was only slightly high at last visit, and the rest of the pituitary hormones were normal, especially in the setting of normal menstrual cycles and lack of galactorrhea, we decided to just follow prolactin level without intervention. -At today's visit, we will check a prolactin level and a nonfasting IGF-I (appointment was in the afternoon), but I do not feel that further investigation of her pituitary function is needed.  Of note, she does not have signs or symptoms of acromegaly: She denies change in size of her rings with change in shoe size except for increased by half her shoe size after the birth of her second child.  Also, she does not have an enlarged jaw.  She does describe increased sweating, which is chronic for her.  She had an OGTT in the past, which was normal, but I do not have the records of her growth hormone.  If her IGF-I levels remain normal, I do not feel that further investigation for this is necessary. -We also discussed that if her prolactin starts to increase, we may need to repeat her MRI, but for now, I do not feel that we need -We also discussed about possible treatments for hyperprolactinemia in her prolactin starts to increase: Usually with Cabergoline once or twice a  week, and rarely, if cabergoline is not tolerated, with bromocriptine. -I will be in touch with the patient regarding the results -I we will see her back in 1 year  4. Palpable thyroid -This was not felt at today's exam -No neck compression symptoms -Previously thyroid ultrasound did not show any nodules -TFTs were normal at last check -No further investigation is needed unless she starts developing neck compression symptoms  Component     Latest Ref Rng & Units 02/22/2020  IGF-I, LC/MS     53 - 331 ng/mL 210  Z-Score (Female)     -2.0 - 2 SD 0.9  Prolactin     ng/mL 17.0  Normal labs.  Philemon Kingdom, MD PhD [facility]

## 2020-02-27 LAB — INSULIN-LIKE GROWTH FACTOR
IGF-I, LC/MS: 210 ng/mL (ref 53–331)
Z-Score (Female): 0.9 SD (ref ?–2.0)

## 2020-02-27 LAB — PROLACTIN: Prolactin: 17 ng/mL

## 2021-01-24 ENCOUNTER — Other Ambulatory Visit: Payer: Self-pay

## 2021-01-24 ENCOUNTER — Ambulatory Visit: Payer: 59 | Admitting: Sports Medicine

## 2021-01-24 VITALS — Ht 63.0 in | Wt 169.0 lb

## 2021-01-24 DIAGNOSIS — M25562 Pain in left knee: Secondary | ICD-10-CM | POA: Diagnosis not present

## 2021-01-24 MED ORDER — PREDNISONE 10 MG PO TABS: ORAL_TABLET | ORAL | 0 refills | Status: DC

## 2021-01-25 NOTE — Progress Notes (Signed)
   Subjective:    Patient ID: Nicole Clark, female    DOB: 1980-12-26, 40 y.o.   MRN: [id number]  HPI chief complaint: Left knee pain  [name] presents today complaining of left knee pain that has been present since June.  Pain began after she did a lot of walking in [place].  Pain is primarily in the anterior knee.  It is worse first thing in the morning or after prolonged sitting.  She is limping.  She had similar symptoms in the right knee in 2015.  An MRI at that time showed nonspecific prepatellar subcutaneous edema and a small Baker's cyst.  Symptoms resolved with oral prednisone.  She denies any mechanical symptoms in the left knee.  No prior knee surgeries.  She was recently seen at [facility] where x-rays were obtained and she was placed on meloxicam.  Meloxicam has not been helpful.  She has not noticed any swelling.  Past medical history reviewed Medications reviewed Allergies reviewed    Review of Systems As above    Objective:   Physical Exam Well-developed, well-nourished.  No acute distress  Left knee: Full range of motion.  No effusion.  No tenderness to palpation along the patellar tendon.  Some slight tenderness in the area of Hoffa's fat pad.  No tenderness along medial lateral joint lines.  Negative anterior drawer, negative posterior drawer.  Knee is stable to valgus and varus stressing.  She does have some pain with Thessaly's testing.  She is neurovascularly intact distally.  X-rays of her left knee from [facility] dated December 29, 2020 are reviewed.  They are an AP, lateral, and sunrise view.  There is some mild narrowing of the medial joint space and a laterally tilted patella.       Assessment & Plan:   Right knee pain possibly secondary to anterior impingement  Since her left knee pain resolved with oral steroids and 2015, we will try a 6-day Sterapred Dosepak.  She was also fitted with a body helix compression sleeve  to wear with activity.  If symptoms do not improve she will contact me and we will consider an MRI to rule out internal derangement such as a meniscal tear.  Follow-up for ongoing or recalcitrant issues.

## 2021-01-26 ENCOUNTER — Ambulatory Visit: Payer: 59 | Admitting: Sports Medicine

## 2021-02-01 ENCOUNTER — Other Ambulatory Visit: Payer: Self-pay

## 2021-02-01 DIAGNOSIS — M25562 Pain in left knee: Secondary | ICD-10-CM

## 2021-02-01 NOTE — Progress Notes (Signed)
Pt called stating she is still having pain and popping in left knee. Wants to know what to do.  Per Dr. Alinda Money note - will proceed with MRI to rule out possible meniscus tear. Order placed. Pt will call to schedule.

## 2021-02-11 ENCOUNTER — Ambulatory Visit
Admission: RE | Admit: 2021-02-11 | Discharge: 2021-02-11 | Disposition: A | Discharge status: Home / Self Care | Payer: 59 | Source: Ambulatory Visit | Attending: Sports Medicine | Admitting: Sports Medicine

## 2021-02-11 ENCOUNTER — Other Ambulatory Visit: Payer: 59

## 2021-02-11 ENCOUNTER — Other Ambulatory Visit: Payer: Self-pay

## 2021-02-11 DIAGNOSIS — M25562 Pain in left knee: Secondary | ICD-10-CM

## 2021-02-15 ENCOUNTER — Telehealth: Payer: Self-pay | Admitting: Sports Medicine

## 2021-02-15 NOTE — Telephone Encounter (Signed)
  I spoke with Nicole Clark on the phone today after reviewing MRI of her left knee.  We proceeded with the MRI after she continued to have knee pain despite oral medication.  MRI shows a mild complex tear of the posterior horn of the medial meniscus in the setting of high-grade partial-thickness cartilage loss of the medial compartment.  I discussed treatment options including an intra-articular cortisone injection versus surgical referral.  She is leaning towards the injection but would like to discuss this further with her husband.  If this is something that she would like to pursue, she can follow-up with me at her convenience.

## 2021-02-21 ENCOUNTER — Other Ambulatory Visit: Payer: Self-pay

## 2021-02-21 ENCOUNTER — Encounter: Payer: Self-pay | Admitting: Internal Medicine

## 2021-02-21 ENCOUNTER — Ambulatory Visit: Payer: 59 | Admitting: Internal Medicine

## 2021-02-21 VITALS — BP 120/82 | HR 77 | Ht 63.0 in | Wt 178.2 lb

## 2021-02-21 DIAGNOSIS — E221 Hyperprolactinemia: Secondary | ICD-10-CM

## 2021-02-21 DIAGNOSIS — D352 Benign neoplasm of pituitary gland: Secondary | ICD-10-CM | POA: Diagnosis not present

## 2021-02-21 NOTE — Patient Instructions (Signed)
Please stop at the lab.  Please come back for a follow-up appointment in 1 year.  

## 2021-02-21 NOTE — Progress Notes (Signed)
Patient ID: Nicole Clark, female   DOB: 1980-09-10, 40 y.o.   MRN: [id number]   This visit occurred during the SARS-CoV-2 public health emergency.  Safety protocols were in place, including screening questions prior to the visit, additional usage of staff PPE, and extensive cleaning of exam room while observing appropriate contact time as indicated for disinfecting solutions.   HPI  Nicole Clark is a 40 y.o.-year-old female-year-old female, initially referred by her PCP, Dr. Einar Pheasant, returning for follow-up for pituitary adenoma.  She was previously seen by Dr. Chalmers Cater and came to see me for a 2nd opinion.  Last visit 1 year ago.  Interim history: At this visit, she mentions left knee pain and she is not seen by orthopedics.  She has meniscal tear and arthritis.  She got a prednisone taper in the last month and may need a steroid injection. She does not complain of any discharge from her breasts and her menstrual cycle are regular. She had a 7 pound weight gain over the summer but she was able to lose them recently.  At today's visit, she weighed with heavy boots and uniform.  Reviewed history: Patient was found to have a goiter by Dr. Garwin Brothers in 2011.  She was referred to see endocrinology at that time. She was found to have an 8 mm pituitary adenoma.  She had another pituitary MRI in 2017 and the adenoma was not visible on the study.  However, pituitary MRI from 08/05/2018 showed a pituitary adenoma, but much smaller, 3-4 mm.  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  The predominant signal characteristic of T1 shortening could indicate some subacute hemorrhage of the tumor,but also prolactinomas can have this appearance natively or if there has been previous treatment with Bromocryptine.  Correlate clinically.  At this time there is no critical compression of adjacent  neural structures.  04/04/2016: Pituitary MRI: Nonvisualization of previously seen 4 mm microadenoma within the left aspect of the pituitary gland. The gland is normal in appearance on today's study, with no discrete lesion now identified.  08/05/2018: Pituitary MRI: 1. Subtle residual of the chronic left side pituitary microadenoma suspected, now only visible on dynamic images and measuring 3-4 mm. Mildly decreased overall pituitary volume since 2017. 2. Stable MRI appearance of the brain since 2015 with mild nonspecific chronic cerebral white matter signal changes.   I reviewed Dr. Almetta Lovely notes and it appears that patient also had a history of slightly elevated IGF-I and prolactin: 06/18/2017: IGF-I 275 (73-243), prolactin 39.7 (4.8-23.3)  07/19/2017 : IGF-I 257 (73-243), prolactin 36.8  (4.8-23.3)  06/16/2018:  GH 0.6, IGF1 240 (69-227), prolactin 24.0 (4.8-23.3), ACTH 22.6, TSH 0.695  06/24/2018:  Normal 3-hour OGTT, but growth hormone results are not available to me  07/14/2018: GH 0.2, IGF1 234 (69-227), prolactin 25.1 (4.8-23.3), LH 6.7, FSH 8.8, estradiol 37.5  Also, reviewed previous labs obtained here in the office: Component     Latest Ref Rng & Units 07/07/2019  Prolactin, Serum (ICMA)     ng/mL 37 (H)  Monomeric Prolactin (ICMA)     ng/mL 27 (H)  Percent Macroprolactin     % 27  IGF-I, LC/MS     53 - 331 ng/mL 196  Z-Score (Female)     -2.0 - 2 SD 0.7  C206 ACTH     6 - 50 pg/mL 18  Cortisol,  Plasma     ug/dL 8.3  TSH     0.35 - 4.50 uIU/mL 0.82  T4,Free(Direct)     0.60 - 1.60 ng/dL 0.83  Triiodothyronine,Free,Serum     2.3 - 4.2 pg/mL 3.4  FSH     mIU/ML 7.9  LH     mIU/mL 7.24  Growth Hormone     < OR = 7.1 ng/mL 0.1   Component     Latest Ref Rng & Units 02/22/2020  IGF-I, LC/MS     53 - 331 ng/mL 210  Z-Score (Female)     -2.0 - 2 SD 0.9  Prolactin     ng/mL 17.0   She continues to have postmenstrual headaches.  She has 2 children.  She  had no problems conceiving.  Her menstrual cycles are regular.  Reviewed her TFTs: Lab Results  Component Value Date   TSH 0.82 07/07/2019   TSH 0.59 05/10/2017   FREET4 0.83 07/07/2019   FREET4 1.17 05/10/2017  06/16/2018: TPO antibodies 8 (0-34) 06/18/2017: TPO antibodies 9 (0-34)  She has a history of palpable thyroid but the thyroid ultrasound showed no nodules.  Pt denies: - feeling nodules in neck - hoarseness - dysphagia - choking - SOB with lying down  ROS: + See HPI Constitutional: + weight gain/+ weight loss, no fatigue, no subjective hyperthermia, no subjective hypothermia Eyes: no blurry vision, no xerophthalmia ENT: no sore throat, + see HPI Cardiovascular: no CP/no SOB/no palpitations/no leg swelling Respiratory: no cough/no SOB/no wheezing Gastrointestinal: no N/no V/no D/no C/no acid reflux Musculoskeletal: no muscle aches/no joint aches Skin: no rashes, + hair loss - will see Dr. Trevor Iha Neurological: no tremors/no numbness/no tingling/no dizziness, + headaches  I reviewed pt's medications, allergies, PMH, social hx, family hx, and changes were documented in the history of present illness. Otherwise, unchanged from my initial visit note.  Past Medical History:  Diagnosis Date   Benign tumor of parathyroid gland    Complication of anesthesia    epidural in labor was ineffective   Fibroids    GERD (gastroesophageal reflux disease)    Gestational diabetes 2016   diet controlled   History of chicken pox    Past Surgical History:  Procedure Laterality Date   CESAREAN SECTION  05/01/2011   Procedure: CESAREAN SECTION;  Surgeon: Margarette Asal;  Location: [facility];  Service: Gynecology;  Laterality: N/A;  Primary cesarean section with delivery of baby boy at 50. Apgars 9/9.   CESAREAN SECTION N/A 10/26/2014   Procedure: Repeat CESAREAN SECTION;  Surgeon: Servando Salina, MD;  Location: [facility];  Service: Obstetrics;  Laterality: N/A;  EDD: 11/01/14    DILATION AND CURETTAGE OF UTERUS     DILATION AND CURETTAGE OF UTERUS  09/2008   WISDOM TOOTH EXTRACTION     Social History   Socioeconomic History   Marital status: Married    Spouse name: Darnelle Maffucci   Number of children: 2   Years of education: Bachelors degree   Highest education level: Not on file  Occupational History   Not on file  Tobacco Use   Smoking status: Never   Smokeless tobacco: Never  Vaping Use   Vaping Use: Never used  Substance and Sexual Activity   Alcohol use: Yes    Alcohol/week: 3.0 standard drinks    Types: 3 Shots of liquor per week    Comment: once a week   Drug use: No   Sexual activity: Yes    Birth control/protection: Surgical  Comment: husband had vasectomy done  Other Topics Concern   Not on file  Social History Narrative   Lives with Husband Darnelle Maffucci   2 children - Alma Friendly and Landon   1 [place] (46)   Enjoys: reading, spending time with family, shopping, travelling    Exercise: video work-out - tries to do this once a week, needs to do better   Diet: tries to eat healthy   Social Determinants of Health   Financial Resource Strain: Not on file  Food Insecurity: Not on file  Transportation Needs: Not on file  Physical Activity: Not on file  Stress: Not on file  Social Connections: Not on file  Intimate Partner Violence: Not on file   Current Outpatient Medications on File Prior to Visit  Medication Sig Dispense Refill   hydrOXYzine (VISTARIL) 25 MG capsule Take 1-2 capsules (25-50 mg total) by mouth 3 (three) times daily as needed for anxiety. (Patient not taking: Reported on 07/07/2019) 60 capsule 1   naproxen sodium (ALEVE) 220 MG tablet Take 220 mg by mouth daily as needed.     Pediatric Multiple Vit-C-FA (FLINSTONES GUMMIES OMEGA-3 DHA PO) Take 1 tablet by mouth daily.     predniSONE (DELTASONE) 10 MG tablet Use as directed per doctors orders. 21 tablet 0   No current facility-administered medications on file prior to visit.    No Known Allergies Family History  Problem Relation Age of Onset   Mental illness Mother        anxiety   Nephrolithiasis Mother    Diabetes Mother    Hypertension Mother    Hearing loss Mother    Hyperlipidemia Mother    Diabetes Father    Hyperlipidemia Father    Hypertension Father    Cancer Maternal Grandmother 45       Pancreatic   Diabetes Brother    PE: BP 120/82 (BP Location: Left Arm, Patient Position: Sitting, Cuff Size: Normal)   Pulse 77   Ht 5\' 3"  (1.6 m)   Wt 178 lb 3.2 oz (80.8 kg)   SpO2 98%   BMI 31.57 kg/m  Wt Readings from Last 3 Encounters:  02/21/21 178 lb 3.2 oz (80.8 kg)  01/24/21 169 lb (76.7 kg)  02/22/20 164 lb (74.4 kg)   Constitutional: normal weight, in NAD Eyes: PERRLA, EOMI, no exophthalmos ENT: moist mucous membranes, no thyromegaly but palpable thyroid, no cervical lymphadenopathy Cardiovascular: RRR, No MRG Respiratory: CTA B Gastrointestinal: abdomen soft, NT, ND, BS+ Musculoskeletal: no deformities, strength intact in all 4 Skin: moist, warm, no rashes Neurological: no tremor with outstretched hands, DTR normal in all 4  ASSESSMENT: 1. Pituitary microadenoma  2.  Elevated prolactin  3.  Elevated IGF-I -Resolved  4. Palpable thyroid -Previous thyroid ultrasound did not show any nodules  PLAN:  1.  Pituitary microadenoma -Found during investigation for elevated prolactin -She also had slightly elevated IGF-I per review of records from previous endocrinologist but this resolved with both IGF-I and growth hormone levels within the normal range repeatedly, including at last visit in 02/2020.  Also, she does not have any enlargement of her jaw, change in size of her rings or change in shoe size except for an increase by half of her shoe size measure after the birth of her second child.  She does describe increased sweating, which is chronic for her.  She had an OGTT in the past which was normal, but I do not have these records.   We will  recheck her IGF-I today (she is fasting) and if normal, we will not repeat this at next visit. -We reviewed together her previous imaging studies.  She initially had an 8 mm pituitary tumor in 2011 which was not seen on the MRI from 2017 (probably due to the thicker MRI slices) but he was seen again in 2020 and this was smaller in size, between 3 to 4 mm.  This is a significant decrease in size, approximately 50%, which is excellent -No pituitary MRIs needed for now unless prolactin starts to increase -Also, if her prolactin level remains normal, there is no need for Cabergoline or bromocriptine  2.  Hyperprolactinemia -We  checked for macro prolactin and investigation was negative. She has slightly elevated monomeric prolactin, most likely secreted by her pituitary microadenoma. -At last visit, prolactin was normal, at 17 -No galactorrhea or tension in breasts.  Also, no acne.  Menstrual cycles are regular. -We will recheck her prolactin level today -I will see her back in a year  Component     Latest Ref Rng & Units 02/21/2021  IGF-I, LC/MS     53 - 331 ng/mL 244  Z-Score (Female)     -2.0 - 2.0 SD 1.2  Prolactin     ng/mL 17.7  Prolactin and IGF-I normal.  Philemon Kingdom, MD PhD [facility] Endocrin[facility]

## 2021-02-26 LAB — INSULIN-LIKE GROWTH FACTOR
IGF-I, LC/MS: 244 ng/mL (ref 53–331)
Z-Score (Female): 1.2 SD (ref ?–2.0)

## 2021-02-26 LAB — PROLACTIN: Prolactin: 17.7 ng/mL

## 2021-08-19 ENCOUNTER — Other Ambulatory Visit: Payer: Self-pay | Admitting: Family Medicine

## 2021-08-19 ENCOUNTER — Other Ambulatory Visit: Payer: Self-pay

## 2021-08-19 ENCOUNTER — Ambulatory Visit
Admission: RE | Admit: 2021-08-19 | Discharge: 2021-08-19 | Disposition: A | Discharge status: Home / Self Care | Payer: 59 | Source: Ambulatory Visit | Attending: Family Medicine | Admitting: Family Medicine

## 2021-08-19 DIAGNOSIS — Z1231 Encounter for screening mammogram for malignant neoplasm of breast: Secondary | ICD-10-CM

## 2021-08-21 ENCOUNTER — Encounter: Payer: Self-pay | Admitting: Family Medicine

## 2021-08-22 ENCOUNTER — Other Ambulatory Visit: Payer: Self-pay | Admitting: Family Medicine

## 2021-08-22 DIAGNOSIS — R928 Other abnormal and inconclusive findings on diagnostic imaging of breast: Secondary | ICD-10-CM

## 2021-08-22 NOTE — Telephone Encounter (Signed)
Called patient to discuss.  ? ?She will call if she doesn't hear on Friday and we will check with the breast center ?

## 2021-08-23 ENCOUNTER — Ambulatory Visit
Admission: RE | Admit: 2021-08-23 | Discharge: 2021-08-23 | Disposition: A | Discharge status: Home / Self Care | Payer: 59 | Source: Ambulatory Visit | Attending: Family Medicine | Admitting: Family Medicine

## 2021-08-23 ENCOUNTER — Other Ambulatory Visit: Payer: Self-pay | Admitting: Family Medicine

## 2021-08-23 DIAGNOSIS — R928 Other abnormal and inconclusive findings on diagnostic imaging of breast: Secondary | ICD-10-CM

## 2021-08-23 DIAGNOSIS — N6489 Other specified disorders of breast: Secondary | ICD-10-CM

## 2022-02-22 ENCOUNTER — Ambulatory Visit: Payer: 59 | Admitting: Internal Medicine

## 2022-02-22 NOTE — Progress Notes (Deleted)
Patient ID: Nicole Clark, female   DOB: [birth year], 41 y.o.   MRN: [id number]   HPI  Nicole Clark is a 41 y.o.-year-old female, initially referred by her PCP, Dr. Einar Pheasant, returning for follow-up for pituitary adenoma.  She was previously seen by Dr. Chalmers Cater and came to see me for a 2nd opinion.  Last visit 1 year ago.  Interim history: No breast discharge, menstrual cycles are regular.  She continues to have left knee pain-seen by orthopedics.  She previously got steroid injections in her knee  Reviewed history: Patient was found to have a goiter by Dr. Garwin Brothers in 2011.  She was referred to see endocrinology at that time. She was found to have an 8 mm pituitary adenoma.  She had another pituitary MRI in 2017 and the adenoma was not visible on the study.  However, pituitary MRI from 08/05/2018 showed a pituitary adenoma, but much smaller, 3-4 mm.  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  The predominant signal characteristic of T1 shortening could indicate some subacute hemorrhage of the tumor,but also prolactinomas can have this appearance natively or if there has been previous treatment with Bromocryptine.  Correlate clinically.  At this time there is no critical compression of adjacent neural structures.  04/04/2016: Pituitary MRI: Nonvisualization of previously seen 4 mm microadenoma within the left aspect of the pituitary gland. The gland is normal in appearance on today's study, with no discrete lesion now identified.  08/05/2018: Pituitary MRI: 1. Subtle residual of the chronic left side pituitary microadenoma suspected, now only visible on dynamic images and measuring 3-4 mm. Mildly decreased overall pituitary volume since 2017. 2. Stable MRI appearance of the brain since 2015 with mild nonspecific chronic cerebral white matter signal  changes.   I reviewed Dr. Almetta Lovely notes and it appears that patient also had a history of slightly elevated IGF-I and prolactin: 06/18/2017: IGF-I 275 (73-243), prolactin 39.7 (4.8-23.3)  07/19/2017 : IGF-I 257 (73-243), prolactin 36.8  (4.8-23.3)  06/16/2018:  GH 0.6, IGF1 240 (69-227), prolactin 24.0 (4.8-23.3), ACTH 22.6, TSH 0.695  06/24/2018:  Normal 3-hour OGTT, but growth hormone results are not available to me  07/14/2018: GH 0.2, IGF1 234 (69-227), prolactin 25.1 (4.8-23.3), LH 6.7, FSH 8.8, estradiol 37.5  Also, reviewed previous labs obtained here in the office: Component     Latest Ref Rng & Units 02/21/2021  IGF-I, LC/MS     53 - 331 ng/mL 244  Z-Score (Female)     -2.0 - 2.0 SD 1.2  Prolactin     ng/mL 17.7   Component     Latest Ref Rng & Units 07/07/2019  Prolactin, Serum (ICMA)     ng/mL 37 (H)  Monomeric Prolactin (ICMA)     ng/mL 27 (H)  Percent Macroprolactin     % 27  IGF-I, LC/MS     53 - 331 ng/mL 196  Z-Score (Female)     -2.0 - 2 SD 0.7  C206 ACTH     6 - 50 pg/mL 18  Cortisol, Plasma     ug/dL 8.3  TSH     0.35 - 4.50 uIU/mL 0.82  T4,Free(Direct)     0.60 - 1.60 ng/dL 0.83  Triiodothyronine,Free,Serum     2.3 - 4.2 pg/mL 3.4  FSH     mIU/ML 7.9  LH     mIU/mL 7.24  Growth Hormone     < OR = 7.1 ng/mL 0.1   Component     Latest Ref Rng & Units 02/22/2020  IGF-I, LC/MS     53 - 331 ng/mL 210  Z-Score (Female)     -2.0 - 2 SD 0.9  Prolactin     ng/mL 17.0   She continues to have postmenstrual headaches.  She has 2 children.  She had no problems conceiving.  Her menstrual cycles are regular.  Reviewed her TFTs: Lab Results  Component Value Date   TSH 0.82 07/07/2019   TSH 0.59 05/10/2017   FREET4 0.83 07/07/2019   FREET4 1.17 05/10/2017  06/16/2018: TPO antibodies 8 (0-34) 06/18/2017: TPO antibodies 9 (0-34)  She has a history of palpable thyroid but the thyroid ultrasound showed no nodules.  Pt denies: - feeling nodules in  neck - hoarseness - dysphagia - choking  ROS:  + see HPI + Hair loss-chronic  I reviewed pt's medications, allergies, PMH, social hx, family hx, and changes were documented in the history of present illness. Otherwise, unchanged from my initial visit note.  Past Medical History:  Diagnosis Date   Benign tumor of parathyroid gland    Complication of anesthesia    epidural in labor was ineffective   Fibroids    GERD (gastroesophageal reflux disease)    Gestational diabetes 2016   diet controlled   History of chicken pox    Past Surgical History:  Procedure Laterality Date   CESAREAN SECTION  05/01/2011   Procedure: CESAREAN SECTION;  Surgeon: Margarette Asal;  Location: [facility];  Service: Gynecology;  Laterality: N/A;  Primary cesarean section with delivery of baby boy at 69. Apgars 9/9.   CESAREAN SECTION N/A 10/26/2014   Procedure: Repeat CESAREAN SECTION;  Surgeon: Servando Salina, MD;  Location: [facility];  Service: Obstetrics;  Laterality: N/A;  EDD: 11/01/14   DILATION AND CURETTAGE OF UTERUS     DILATION AND CURETTAGE OF UTERUS  09/2008   WISDOM TOOTH EXTRACTION     Social History   Socioeconomic History   Marital status: Married    Spouse name: Darnelle Maffucci   Number of children: 2   Years of education: Bachelors degree   Highest education level: Not on file  Occupational History   Not on file  Tobacco Use   Smoking status: Never   Smokeless tobacco: Never  Vaping Use   Vaping Use: Never used  Substance and Sexual Activity   Alcohol use: Yes    Alcohol/week: 3.0 standard drinks of alcohol    Types: 3 Shots of liquor per week    Comment: once a week   Drug use: No   Sexual activity: Yes    Birth control/protection: Surgical    Comment: husband had vasectomy done  Other Topics Concern   Not on file  Social History Narrative   Lives with Husband Darnelle Maffucci   2 children - Alma Friendly and [place]   1 [place] (33)   Enjoys: reading, spending time with family,  shopping, travelling    Exercise: video work-out - tries to do this once a week, needs to do better   Diet: tries to eat healthy   Social Determinants of Health   Financial Resource Strain: Low Risk  (05/12/2018)   Overall Financial Resource Strain (CARDIA)    Difficulty of Paying Living Expenses: Not hard at all  Food Insecurity: Not on file  Transportation Needs: Not on file  Physical Activity: Not on file  Stress: Not on file  Social Connections: Not on file  Intimate Partner Violence: Not on file   Current Outpatient Medications on File Prior to Visit  Medication Sig Dispense Refill   hydrOXYzine (VISTARIL) 25 MG capsule Take 1-2 capsules (25-50 mg total) by mouth 3 (three) times daily as needed for anxiety. (Patient not taking: Reported on 07/07/2019) 60 capsule 1   naproxen sodium (ALEVE) 220 MG tablet Take 220 mg by mouth daily as needed.     Pediatric Multiple Vit-C-FA (FLINSTONES GUMMIES OMEGA-3 DHA PO) Take 1 tablet by mouth daily.     predniSONE (DELTASONE) 10 MG tablet Use as directed per doctors orders. 21 tablet 0   No current facility-administered medications on file prior to visit.   No Known Allergies Family History  Problem Relation Age of Onset   Mental illness Mother        anxiety   Nephrolithiasis Mother    Diabetes Mother    Hypertension Mother    Hearing loss Mother    Hyperlipidemia Mother    Diabetes Father    Hyperlipidemia Father    Hypertension Father    Cancer Maternal Grandmother 16       Pancreatic   Diabetes Brother    PE: There were no vitals taken for this visit. Wt Readings from Last 3 Encounters:  02/21/21 178 lb 3.2 oz (80.8 kg)  01/24/21 169 lb (76.7 kg)  02/22/20 164 lb (74.4 kg)  Constitutional: Slightly overweight, in NAD Eyes:  EOMI, no exophthalmos ENT: no neck masses, no cervical lymphadenopathy Cardiovascular: RRR, No MRG Respiratory: CTA B Musculoskeletal: no deformities Skin:no rashes Neurological: no tremor with  outstretched hands  ASSESSMENT: 1. Pituitary microadenoma  2.  Elevated prolactin  3.  Elevated IGF-I -Resolved  4. Palpable thyroid -Previous thyroid ultrasound did not show any nodules  PLAN:  1.  Pituitary microadenoma -Discovered during investigation for elevated prolactin -She also had slightly elevated IGF-I per review of records from previous endocrinologist but this resolved, with normal IGF-I and growth hormone levels repeatedly.  IGF-I was again normal at last check in 02/2021.  Also, she does not have any enlargement of the jaw, change in size of her rings or change in shoe size except for increased by half of her shoe size measured after the birth of her second child.  She has increased sweating, which is chronic for her.  She had an OGTT in the past which was normal reportedly.  I did not feel that further IGF-I checks are necessary. -Found during investigation for elevated prolactin -We reviewed her previous imaging studies.  She initially had an 8 mm pituitary tumor in the thousand 11 which was not seen on the MRI from 2017 (probably due to the thicker MRI slices), but it was seen again in 2020 and it appears smaller in size, measuring 3 to 4 mm.  This is a significant decrease, approximately 50%, which is excellent. -No pituitary MRIs are needed for now, unless prolactin starts increasing -So, if her prolactin levels remain normal, there is no need for Cabergoline of bromocriptine  2.  Hyperprolactinemia -We checked a marker prolactin and the investigation for this was negative.  She has slightly elevated monomeric prolactin, most likely secreted by her pituitary microadenoma -At last visit, prolactin was stable, at 17.7 -At today's visit, she does not describe new headaches, galactorrhea, or tension in breasts.  Her menstrual cycles are regular. -We will recheck prolactin level today -I will see her back in a  year  Philemon Kingdom, MD PhD [facility] Endocrinology

## 2022-02-26 ENCOUNTER — Other Ambulatory Visit: Payer: Self-pay | Admitting: Family Medicine

## 2022-02-26 ENCOUNTER — Ambulatory Visit: Payer: 59

## 2022-02-26 ENCOUNTER — Ambulatory Visit
Admission: RE | Admit: 2022-02-26 | Discharge: 2022-02-26 | Disposition: A | Discharge status: Home / Self Care | Payer: 59 | Source: Ambulatory Visit | Attending: Family Medicine | Admitting: Family Medicine

## 2022-02-26 DIAGNOSIS — N6489 Other specified disorders of breast: Secondary | ICD-10-CM

## 2022-10-22 ENCOUNTER — Ambulatory Visit
Admission: RE | Admit: 2022-10-22 | Discharge: 2022-10-22 | Disposition: A | Discharge status: Home / Self Care | Payer: 59 | Source: Ambulatory Visit | Attending: Family Medicine | Admitting: Family Medicine

## 2022-10-22 DIAGNOSIS — N6489 Other specified disorders of breast: Secondary | ICD-10-CM

## 2022-11-27 ENCOUNTER — Encounter: Payer: 59 | Admitting: Primary Care

## 2023-01-02 ENCOUNTER — Encounter (INDEPENDENT_AMBULATORY_CARE_PROVIDER_SITE_OTHER): Payer: Self-pay

## 2023-01-24 ENCOUNTER — Encounter: Payer: 59 | Admitting: Primary Care

## 2023-02-19 ENCOUNTER — Encounter: Payer: Self-pay | Admitting: Primary Care

## 2023-02-19 ENCOUNTER — Other Ambulatory Visit: Payer: Self-pay | Admitting: Primary Care

## 2023-02-19 ENCOUNTER — Ambulatory Visit: Payer: 59 | Admitting: Primary Care

## 2023-02-19 VITALS — BP 112/66 | HR 88 | Temp 97.8°F | Ht 63.0 in | Wt 175.0 lb

## 2023-02-19 DIAGNOSIS — D352 Benign neoplasm of pituitary gland: Secondary | ICD-10-CM | POA: Diagnosis not present

## 2023-02-19 DIAGNOSIS — Z23 Encounter for immunization: Secondary | ICD-10-CM | POA: Diagnosis not present

## 2023-02-19 DIAGNOSIS — F5089 Other specified eating disorder: Secondary | ICD-10-CM

## 2023-02-19 DIAGNOSIS — E221 Hyperprolactinemia: Secondary | ICD-10-CM | POA: Diagnosis not present

## 2023-02-19 DIAGNOSIS — L659 Nonscarring hair loss, unspecified: Secondary | ICD-10-CM | POA: Diagnosis not present

## 2023-02-19 DIAGNOSIS — Z0001 Encounter for general adult medical examination with abnormal findings: Secondary | ICD-10-CM | POA: Diagnosis not present

## 2023-02-19 DIAGNOSIS — R7303 Prediabetes: Secondary | ICD-10-CM

## 2023-02-19 DIAGNOSIS — D509 Iron deficiency anemia, unspecified: Secondary | ICD-10-CM

## 2023-02-19 DIAGNOSIS — R7989 Other specified abnormal findings of blood chemistry: Secondary | ICD-10-CM

## 2023-02-19 DIAGNOSIS — Z0000 Encounter for general adult medical examination without abnormal findings: Secondary | ICD-10-CM | POA: Insufficient documentation

## 2023-02-19 DIAGNOSIS — F411 Generalized anxiety disorder: Secondary | ICD-10-CM

## 2023-02-19 LAB — COMPREHENSIVE METABOLIC PANEL
ALT: 11 U/L (ref 0–35)
AST: 13 U/L (ref 0–37)
Albumin: 3.9 g/dL (ref 3.5–5.2)
Alkaline Phosphatase: 48 U/L (ref 39–117)
BUN: 11 mg/dL (ref 6–23)
CO2: 30 meq/L (ref 19–32)
Calcium: 9.2 mg/dL (ref 8.4–10.5)
Chloride: 105 meq/L (ref 96–112)
Creatinine, Ser: 0.87 mg/dL (ref 0.40–1.20)
GFR: 82.66 mL/min (ref >60.00)
Glucose, Bld: 108 mg/dL — ABNORMAL HIGH (ref 70–99)
Potassium: 4 meq/L (ref 3.5–5.1)
Sodium: 140 meq/L (ref 135–145)
Total Bilirubin: 0.7 mg/dL (ref 0.2–1.2)
Total Protein: 6.8 g/dL (ref 6.0–8.3)

## 2023-02-19 LAB — LIPID PANEL
Cholesterol: 240 mg/dL — ABNORMAL HIGH (ref 0–200)
HDL: 76 mg/dL (ref >39.00)
LDL Cholesterol: 153 mg/dL — ABNORMAL HIGH (ref 0–99)
NonHDL: 163.59
Total CHOL/HDL Ratio: 3
Triglycerides: 55 mg/dL (ref 0.0–149.0)
VLDL: 11 mg/dL (ref 0.0–40.0)

## 2023-02-19 LAB — HEMOGLOBIN A1C: Hgb A1c MFr Bld: 6.1 % (ref 4.6–6.5)

## 2023-02-19 LAB — CBC
HCT: 37.5 % (ref 36.0–46.0)
Hemoglobin: 11.6 g/dL — ABNORMAL LOW (ref 12.0–15.0)
MCHC: 30.9 g/dL (ref 30.0–36.0)
MCV: 83.8 fl (ref 78.0–100.0)
Platelets: 208 10*3/uL (ref 150.0–400.0)
RBC: 4.48 Mil/uL (ref 3.87–5.11)
RDW: 16.6 % — ABNORMAL HIGH (ref 11.5–15.5)
WBC: 3.1 10*3/uL — ABNORMAL LOW (ref 4.0–10.5)

## 2023-02-19 LAB — IBC + FERRITIN
Ferritin: 4.3 ng/mL — ABNORMAL LOW (ref 10.0–291.0)
Iron: 76 ug/dL (ref 42–145)
Saturation Ratios: 16.1 % — ABNORMAL LOW (ref 20.0–50.0)
TIBC: 473.2 ug/dL — ABNORMAL HIGH (ref 250.0–450.0)
Transferrin: 338 mg/dL (ref 212.0–360.0)

## 2023-02-19 LAB — TSH: TSH: 1.03 u[IU]/mL (ref 0.35–5.50)

## 2023-02-19 NOTE — Assessment & Plan Note (Signed)
Following with dermatology

## 2023-02-19 NOTE — Assessment & Plan Note (Signed)
Immunizations UTD. Influenza vaccine provided today. Pap smear due, she will schedule with GYN Mammogram UTD and reviewed  Discussed the importance of a healthy diet and regular exercise in order for weight loss, and to reduce the risk of further co-morbidity.  Exam stable. Labs pending.  Follow up in 1 year for repeat physical.

## 2023-02-19 NOTE — Assessment & Plan Note (Signed)
Stable. Repeat prolactin level pending. Reviewed labs from endocrinology from 2022.

## 2023-02-19 NOTE — Assessment & Plan Note (Signed)
Reviewed endocrinology notes from September 2022. No further MRIs needed unless prolactin levels start to increase.  Repeat prolactin level today.

## 2023-02-19 NOTE — Patient Instructions (Signed)
Stop by the lab prior to leaving today. I will notify you of your results once received.   Complete your Pap smear as discussed.  It was a pleasure meeting you!

## 2023-02-19 NOTE — Assessment & Plan Note (Signed)
Reviewed endocrinology notes from 2022. Repeat level pending.

## 2023-02-19 NOTE — Assessment & Plan Note (Signed)
No obvious cause, specifically acute blood loss. Checking CBC and iron studies today.

## 2023-02-19 NOTE — Assessment & Plan Note (Signed)
Controlled.  No concerns today. Remain off treatment.

## 2023-02-19 NOTE — Progress Notes (Signed)
Subjective:    Patient ID: Bobbie Stack, female    DOB: February 10, 1981, 42 y.o.   MRN: [id number]  HPI  STANISHA SCHACHER is a very pleasant 42 y.o. female patient of Dr. Selena Batten who presents today to transfer care and for complete physical.    1) Pituitary Adenoma: Evaluated by endocrinology in 2022, once by Dr. Talmage Nap and once by Dr. Lafe Garin. Last visit was in September 2022. She underwent pituitary MRI in August 2011, November 2017, and March 2020.  The adenoma decreased in size by approximately 50% in 2020 so was determined that no further MRIs were needed unless prolactin levels began to increase.  Her last prolactin level was checked in September 2022.  She has not seen endocrinology since.  2) Ice Cravings: Chronic for the last 3-4 months. She has heavy menstrual cycles for about 2 days, then lighter for a few days. She denies breakthrough vaginal bleeding, rectal bleeding, prior history of anemia except during pregnancy. She presented to [facility] in June 2024, was told "everything looks good".   Immunizations: -Tetanus: Completed in 2016 -Influenza: Influenza vaccine provided today.  Diet: Fair diet.  Exercise: Walking  Eye exam: Completes annually  Dental exam: Completes semi-annually    Pap Smear: UTD, follows with GYN. She will schedule.  Mammogram: Completed in May 2024, due for diagnostic mammogram in May 2025  BP Readings from Last 3 Encounters:  02/19/23 112/66  02/21/21 120/82  02/22/20 128/80      Review of Systems  Constitutional:  Negative for unexpected weight change.  HENT:  Negative for rhinorrhea.   Respiratory:  Negative for cough and shortness of breath.   Cardiovascular:  Negative for chest pain.  Gastrointestinal:  Negative for constipation and diarrhea.  Genitourinary:  Negative for difficulty urinating and menstrual problem.  Musculoskeletal:  Negative for arthralgias and myalgias.  Skin:  Negative for rash.  Allergic/Immunologic: Negative for  environmental allergies.  Neurological:  Negative for dizziness, numbness and headaches.  Psychiatric/Behavioral:  The patient is not nervous/anxious.          Past Medical History:  Diagnosis Date   Benign tumor of parathyroid gland    Cervical strain 05/12/2018   Complication of anesthesia    epidural in labor was ineffective   Fibroids    GERD (gastroesophageal reflux disease)    Gestational diabetes 2016   diet controlled   History of chicken pox     Social History   Socioeconomic History   Marital status: Married    Spouse name: Feliz Beam   Number of children: 2   Years of education: Bachelors degree   Highest education level: Not on file  Occupational History   Not on file  Tobacco Use   Smoking status: Never   Smokeless tobacco: Never  Vaping Use   Vaping status: Never Used  Substance and Sexual Activity   Alcohol use: Yes    Alcohol/week: 3.0 standard drinks of alcohol    Types: 3 Shots of liquor per week    Comment: once a week   Drug use: No   Sexual activity: Yes    Birth control/protection: Surgical    Comment: husband had vasectomy done  Other Topics Concern   Not on file  Social History Narrative   Lives with Husband Feliz Beam   2 children - Isabelle Course and [place]   1 stepson - Curator (15)   Enjoys: reading, spending time with family, shopping, travelling    Exercise: video work-out -  tries to do this once a week, needs to do better   Diet: tries to eat healthy   Social Determinants of Health   Financial Resource Strain: Low Risk  (05/12/2018)   Overall Financial Resource Strain (CARDIA)    Difficulty of Paying Living Expenses: Not hard at all  Food Insecurity: Not on file  Transportation Needs: Not on file  Physical Activity: Not on file  Stress: Not on file  Social Connections: Not on file  Intimate Partner Violence: Not on file    Past Surgical History:  Procedure Laterality Date   CESAREAN SECTION  05/01/2011   Procedure: CESAREAN SECTION;   Surgeon: Meriel Pica;  Location: [facility];  Service: Gynecology;  Laterality: N/A;  Primary cesarean section with delivery of baby boy at 1. Apgars 9/9.   CESAREAN SECTION N/A 10/26/2014   Procedure: Repeat CESAREAN SECTION;  Surgeon: Maxie Better, MD;  Location: [facility];  Service: Obstetrics;  Laterality: N/A;  EDD: 11/01/14   DILATION AND CURETTAGE OF UTERUS     DILATION AND CURETTAGE OF UTERUS  09/2008   WISDOM TOOTH EXTRACTION      Family History  Problem Relation Age of Onset   Mental illness Mother        anxiety   Nephrolithiasis Mother    Diabetes Mother    Hypertension Mother    Hearing loss Mother    Hyperlipidemia Mother    Diabetes Father    Hyperlipidemia Father    Hypertension Father    Cancer Maternal Grandmother 85       Pancreatic   Diabetes Brother     No Known Allergies  Current Outpatient Medications on File Prior to Visit  Medication Sig Dispense Refill   naproxen sodium (ALEVE) 220 MG tablet Take 220 mg by mouth daily as needed.     Pediatric Multiple Vit-C-FA (FLINSTONES GUMMIES OMEGA-3 DHA PO) Take 1 tablet by mouth daily.     No current facility-administered medications on file prior to visit.    BP 112/66   Pulse 88   Temp 97.8 F (36.6 C) (Temporal)   Ht 5\' 3"  (1.6 m)   Wt 175 lb (79.4 kg)   LMP 02/06/2023 (Approximate)   SpO2 97%   BMI 31.00 kg/m  Objective:   Physical Exam HENT:     Right Ear: Tympanic membrane and ear canal normal.     Left Ear: Tympanic membrane and ear canal normal.     Nose: Nose normal.  Eyes:     Conjunctiva/sclera: Conjunctivae normal.     Pupils: Pupils are equal, round, and reactive to light.  Neck:     Thyroid: No thyromegaly.  Cardiovascular:     Rate and Rhythm: Normal rate and regular rhythm.     Heart sounds: No murmur heard. Pulmonary:     Effort: Pulmonary effort is normal.     Breath sounds: Normal breath sounds. No rales.  Abdominal:     General: Bowel sounds are normal.      Palpations: Abdomen is soft.     Tenderness: There is no abdominal tenderness.  Musculoskeletal:        General: Normal range of motion.     Cervical back: Neck supple.  Lymphadenopathy:     Cervical: No cervical adenopathy.  Skin:    General: Skin is warm and dry.     Findings: No rash.  Neurological:     Mental Status: She is alert and oriented to person, place, and time.  Cranial Nerves: No cranial nerve deficit.     Deep Tendon Reflexes: Reflexes are normal and symmetric.  Psychiatric:        Mood and Affect: Mood normal.           Assessment & Plan:  Encounter for annual general medical examination with abnormal findings in adult Assessment & Plan: Immunizations UTD. Influenza vaccine provided today. Pap smear due, she will schedule with GYN Mammogram UTD and reviewed  Discussed the importance of a healthy diet and regular exercise in order for weight loss, and to reduce the risk of further co-morbidity.  Exam stable. Labs pending.  Follow up in 1 year for repeat physical.    Alopecia Assessment & Plan: Following with dermatology.    Pituitary adenoma [facility]) Assessment & Plan: Reviewed endocrinology notes from September 2022. No further MRIs needed unless prolactin levels start to increase.  Repeat prolactin level today.  Orders: -     Lipid panel -     Hemoglobin A1c -     Comprehensive metabolic panel -     TSH  Hyperprolactinemia (HCC) Assessment & Plan: Stable. Repeat prolactin level pending. Reviewed labs from endocrinology from 2022.  Orders: -     Lipid panel -     Hemoglobin A1c -     TSH -     Prolactin  Encounter for immunization -     Flu vaccine trivalent PF, 6mos and older(Flulaval,Afluria,Fluarix,Fluzone)  Generalized anxiety disorder Assessment & Plan: Controlled. No concerns today.  Remain off treatment.   High insulin-like growth factor 1 (IGF-1) level Assessment & Plan: Reviewed endocrinology notes from  2022. Repeat level pending.  Orders: -     Insulin-like growth factor  Pica Assessment & Plan: No obvious cause, specifically acute blood loss. Checking CBC and iron studies today.  Orders: -     CBC -     IBC + Ferritin        Doreene Nest, NP

## 2023-02-25 LAB — INSULIN-LIKE GROWTH FACTOR
IGF-I, LC/MS: 220 ng/mL (ref 52–328)
Z-Score (Female): 1 SD (ref ?–2.0)

## 2023-02-25 LAB — PROLACTIN: Prolactin: 21.4 ng/mL

## 2023-05-09 ENCOUNTER — Encounter: Payer: Self-pay | Admitting: Nurse Practitioner

## 2023-10-30 IMAGING — MG MM DIGITAL DIAGNOSTIC UNILAT*R* W/ TOMO W/ CAD
4 series · 4 of 12 positions shown · non-contrast
Comparison: Screening mammogram dated 08/19/2021.

CLINICAL DATA: Screening recall from baseline mammography for
possible right breast asymmetry.

EXAM:
DIGITAL DIAGNOSTIC UNILATERAL RIGHT MAMMOGRAM WITH TOMOSYNTHESIS AND
CAD; ULTRASOUND RIGHT BREAST LIMITED
TECHNIQUE: Right digital diagnostic mammography and breast tomosynthesis was
performed. The images were evaluated with computer-aided detection.;
Targeted ultrasound examination of the right breast was performed

[R MLO synth-2D]
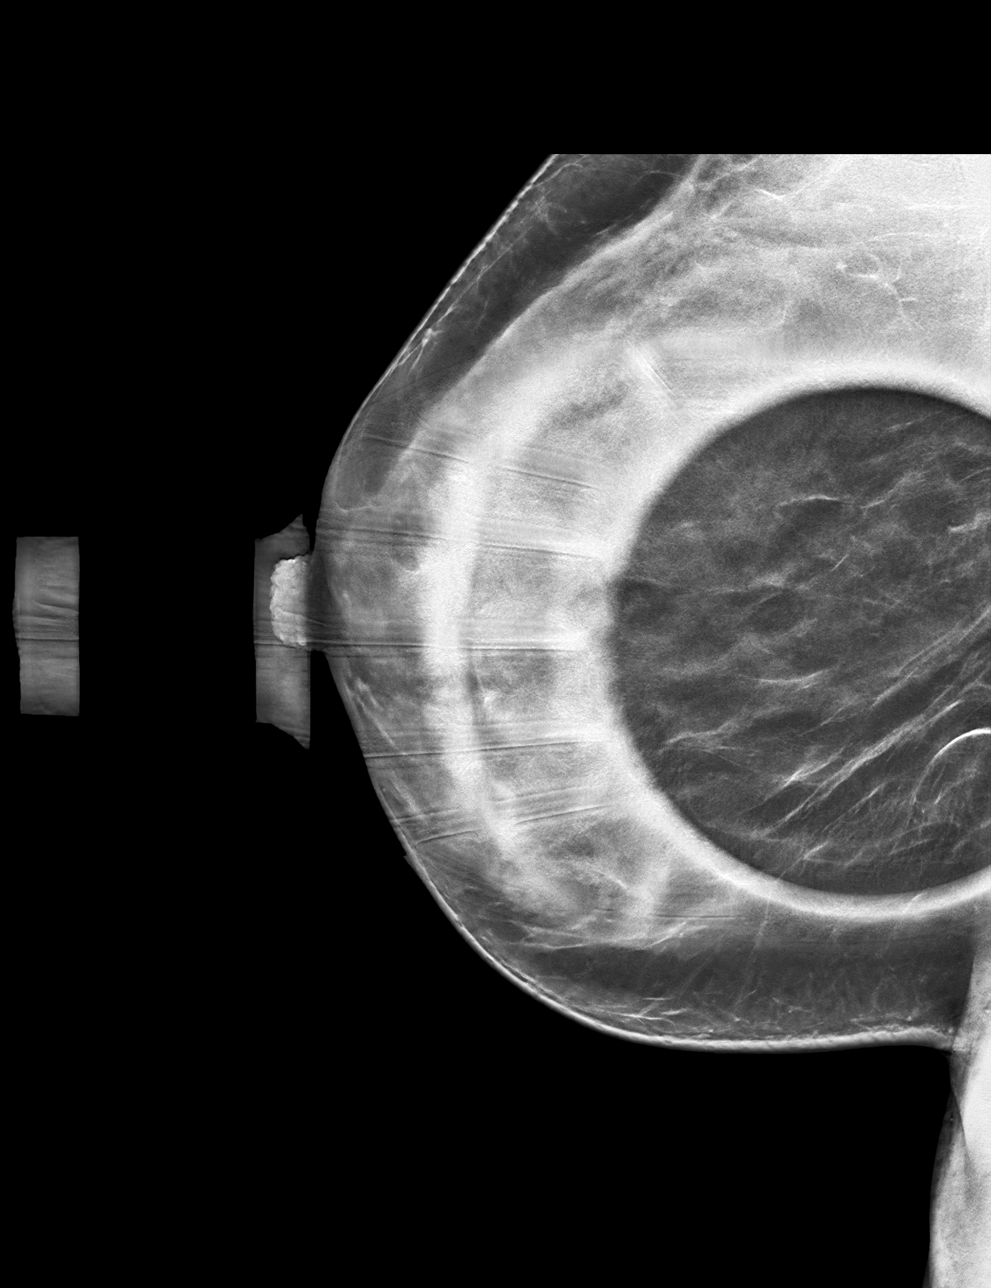

[R CC synth-2D]
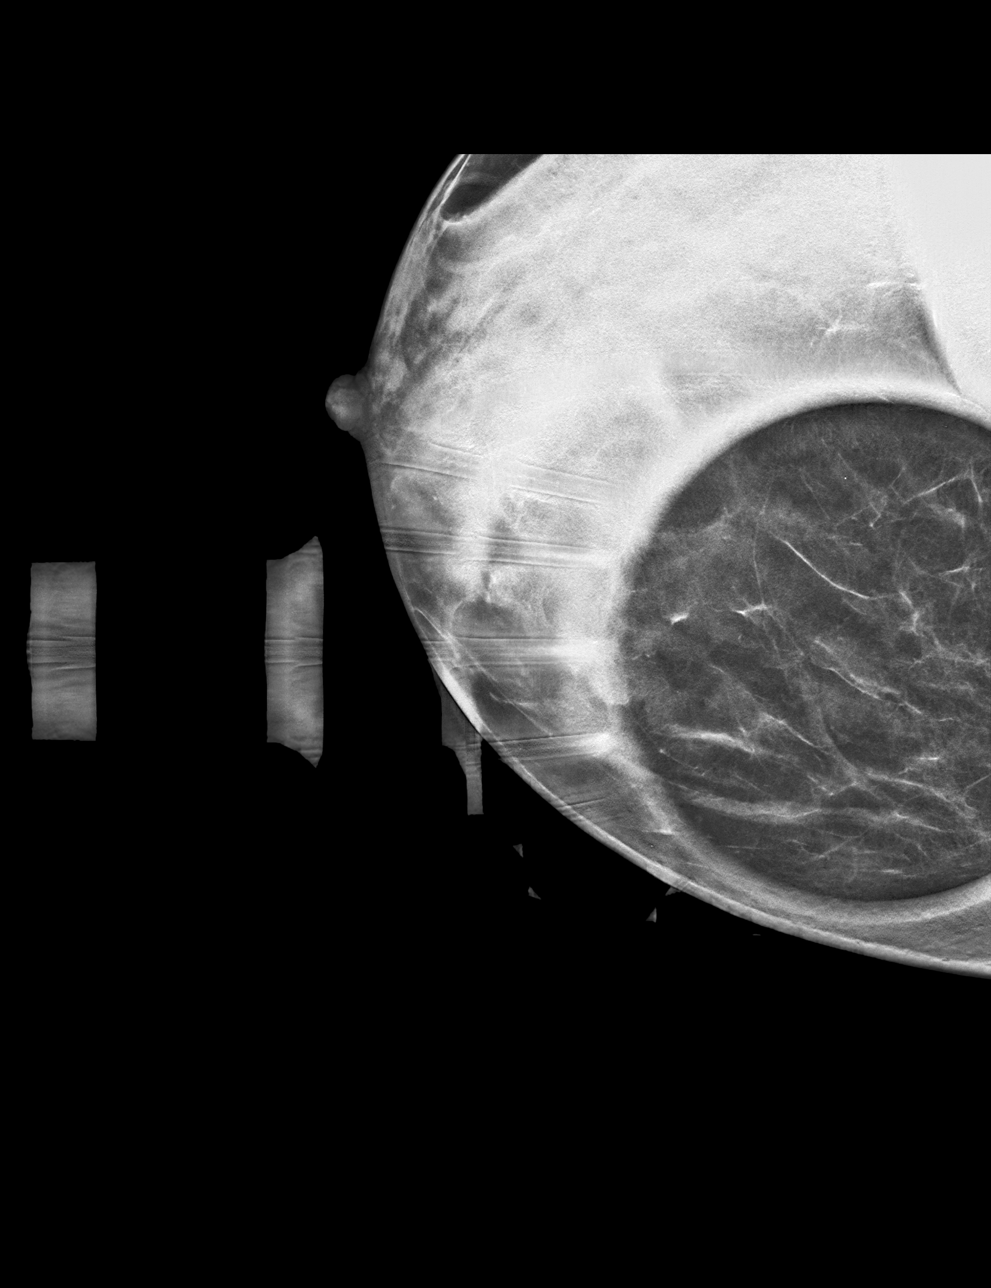

[R MLO tomo · tomo slice 31/62.0]
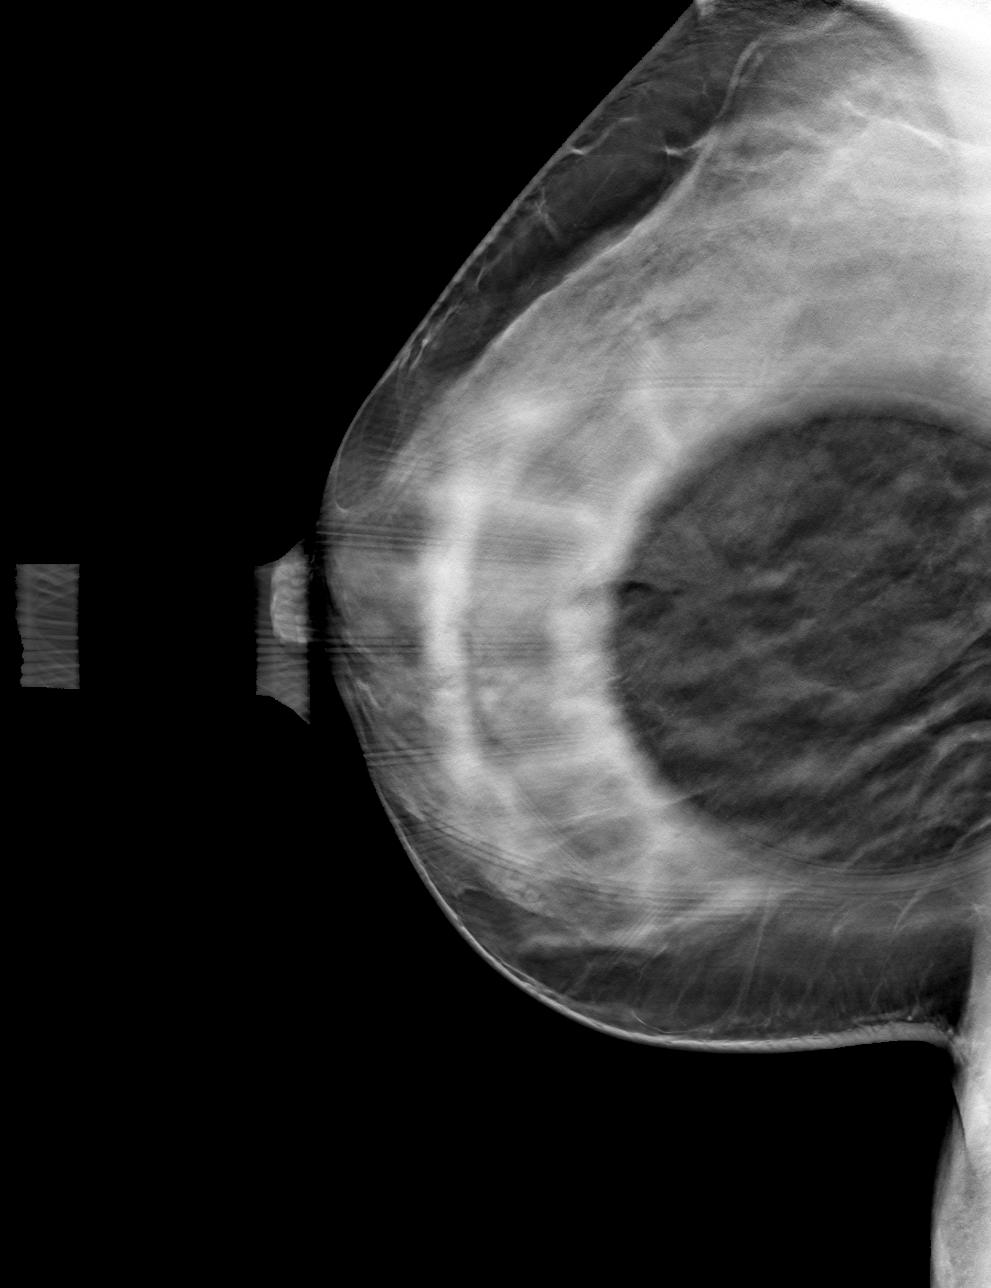

[R CC tomo · tomo slice 27/54.0]
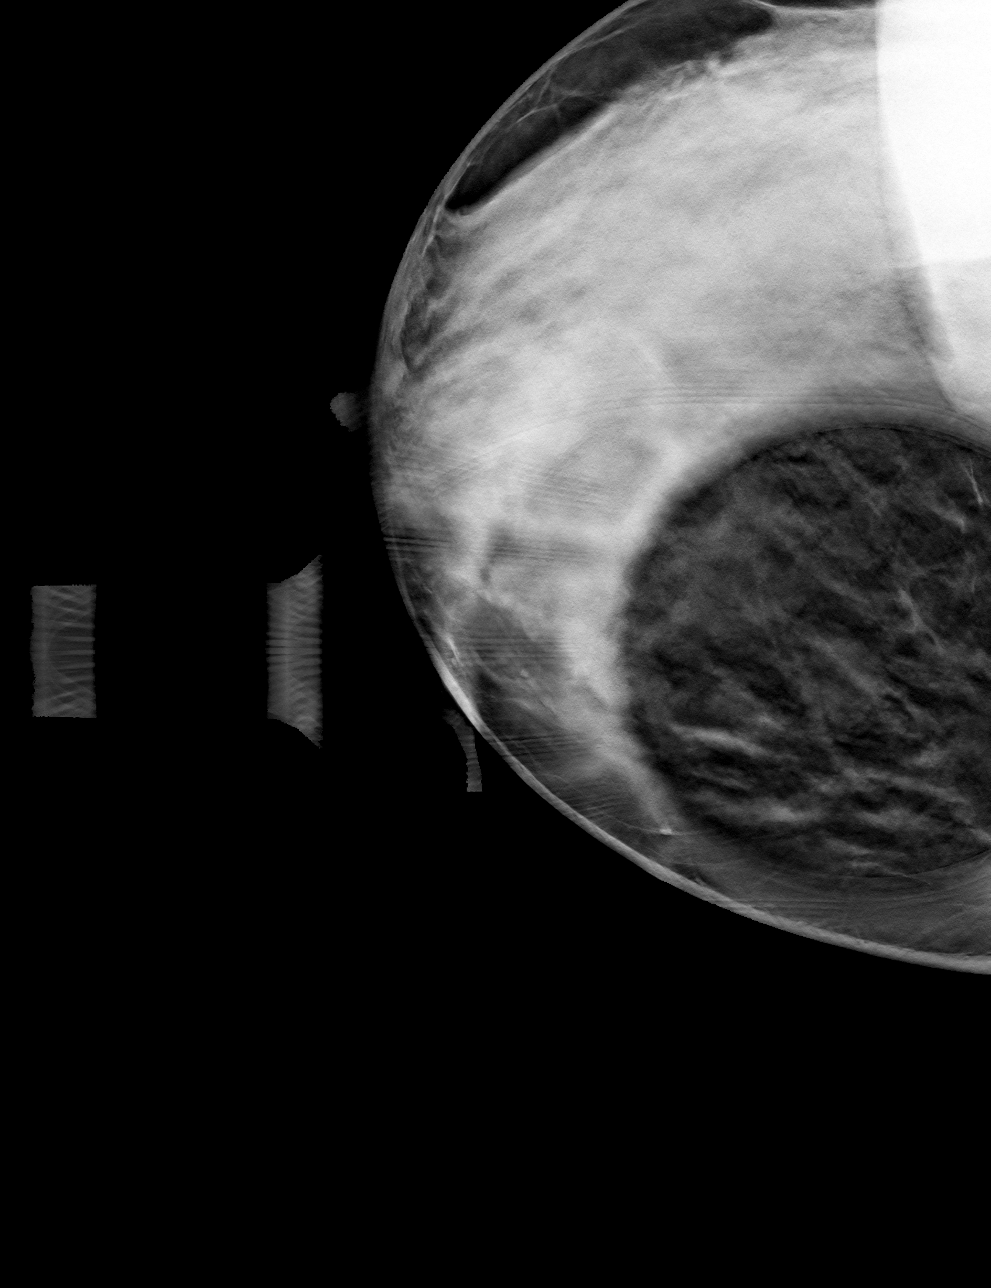

[4 of 12 positions shown; findings below may reference images not displayed]

ACR Breast Density Category d: The breast tissue is extremely dense,
which lowers the sensitivity of mammography.
FINDINGS: Additional tomograms were performed of the right breast. The
initially questioned possible right breast asymmetry is not
definitely identified on the additional spot compression tomograms.

Targeted ultrasound of the central 2 inner right breast was
performed. No suspicious masses or abnormality seen, only
heterogeneous fibroglandular tissue identified.
IMPRESSION: Probably benign right breast asymmetry.

RECOMMENDATION:
Diagnostic mammography of the right breast with possible ultrasound
in 6 months.

I have discussed the findings and recommendations with the patient.
If applicable, a reminder letter will be sent to the patient
regarding the next appointment.

BI-RADS CATEGORY  3: Probably benign.

## 2023-12-09 ENCOUNTER — Other Ambulatory Visit: Payer: Self-pay | Admitting: Obstetrics and Gynecology

## 2023-12-09 DIAGNOSIS — N6489 Other specified disorders of breast: Secondary | ICD-10-CM

## 2023-12-18 ENCOUNTER — Ambulatory Visit
Admission: RE | Admit: 2023-12-18 | Discharge: 2023-12-18 | Disposition: A | Discharge status: Home / Self Care | Source: Ambulatory Visit | Attending: Obstetrics and Gynecology | Admitting: Obstetrics and Gynecology

## 2023-12-18 DIAGNOSIS — N6489 Other specified disorders of breast: Secondary | ICD-10-CM

## 2024-02-19 ENCOUNTER — Ambulatory Visit: Payer: 59 | Admitting: Primary Care

## 2024-02-19 ENCOUNTER — Encounter: Payer: Self-pay | Admitting: Primary Care

## 2024-02-19 VITALS — BP 110/64 | HR 77 | Temp 97.8°F | Ht 63.0 in | Wt 182.0 lb

## 2024-02-19 DIAGNOSIS — R7303 Prediabetes: Secondary | ICD-10-CM | POA: Diagnosis not present

## 2024-02-19 DIAGNOSIS — Z0000 Encounter for general adult medical examination without abnormal findings: Secondary | ICD-10-CM

## 2024-02-19 DIAGNOSIS — Z23 Encounter for immunization: Secondary | ICD-10-CM | POA: Diagnosis not present

## 2024-02-19 DIAGNOSIS — D509 Iron deficiency anemia, unspecified: Secondary | ICD-10-CM

## 2024-02-19 DIAGNOSIS — E785 Hyperlipidemia, unspecified: Secondary | ICD-10-CM | POA: Diagnosis not present

## 2024-02-19 DIAGNOSIS — F411 Generalized anxiety disorder: Secondary | ICD-10-CM | POA: Diagnosis not present

## 2024-02-19 DIAGNOSIS — E221 Hyperprolactinemia: Secondary | ICD-10-CM | POA: Diagnosis not present

## 2024-02-19 DIAGNOSIS — R7989 Other specified abnormal findings of blood chemistry: Secondary | ICD-10-CM

## 2024-02-19 LAB — IBC + FERRITIN
Ferritin: 3.8 ng/mL — ABNORMAL LOW (ref 10.0–291.0)
Iron: 18 ug/dL — ABNORMAL LOW (ref 42–145)
Saturation Ratios: 3.9 % — ABNORMAL LOW (ref 20.0–50.0)
TIBC: 464.8 ug/dL — ABNORMAL HIGH (ref 250.0–450.0)
Transferrin: 332 mg/dL (ref 212.0–360.0)

## 2024-02-19 LAB — CBC
HCT: 30.5 % — ABNORMAL LOW (ref 36.0–46.0)
Hemoglobin: 9.6 g/dL — ABNORMAL LOW (ref 12.0–15.0)
MCHC: 31.6 g/dL (ref 30.0–36.0)
MCV: 79.3 fl (ref 78.0–100.0)
Platelets: 236 K/uL (ref 150.0–400.0)
RBC: 3.84 Mil/uL — ABNORMAL LOW (ref 3.87–5.11)
RDW: 16.2 % — ABNORMAL HIGH (ref 11.5–15.5)
WBC: 3 K/uL — ABNORMAL LOW (ref 4.0–10.5)

## 2024-02-19 LAB — COMPREHENSIVE METABOLIC PANEL WITH GFR
ALT: 10 U/L (ref 0–35)
AST: 13 U/L (ref 0–37)
Albumin: 3.9 g/dL (ref 3.5–5.2)
Alkaline Phosphatase: 42 U/L (ref 39–117)
BUN: 11 mg/dL (ref 6–23)
CO2: 29 meq/L (ref 19–32)
Calcium: 9.2 mg/dL (ref 8.4–10.5)
Chloride: 104 meq/L (ref 96–112)
Creatinine, Ser: 0.76 mg/dL (ref 0.40–1.20)
GFR: 96.54 mL/min (ref >60.00)
Glucose, Bld: 101 mg/dL — ABNORMAL HIGH (ref 70–99)
Potassium: 3.7 meq/L (ref 3.5–5.1)
Sodium: 139 meq/L (ref 135–145)
Total Bilirubin: 0.4 mg/dL (ref 0.2–1.2)
Total Protein: 6.7 g/dL (ref 6.0–8.3)

## 2024-02-19 LAB — LIPID PANEL
Cholesterol: 208 mg/dL — ABNORMAL HIGH (ref 0–200)
HDL: 66 mg/dL (ref >39.00)
LDL Cholesterol: 135 mg/dL — ABNORMAL HIGH (ref 0–99)
NonHDL: 141.7
Total CHOL/HDL Ratio: 3
Triglycerides: 33 mg/dL (ref 0.0–149.0)
VLDL: 6.6 mg/dL (ref 0.0–40.0)

## 2024-02-19 LAB — HEMOGLOBIN A1C: Hgb A1c MFr Bld: 6.7 % — ABNORMAL HIGH (ref 4.6–6.5)

## 2024-02-19 NOTE — Assessment & Plan Note (Signed)
Immunizations UTD. Influenza vaccine provided today. Pap smear UTD. Follows with GYN Mammogram UTD.   Discussed the importance of a healthy diet and regular exercise in order for weight loss, and to reduce the risk of further co-morbidity.  Exam stable. Labs pending.  Follow up in 1 year for repeat physical.

## 2024-02-19 NOTE — Assessment & Plan Note (Signed)
 Continued ice cravings.  IBC panel pending. Following with GYN for fibroids.

## 2024-02-19 NOTE — Assessment & Plan Note (Signed)
 Strong family history.  Repeat lipid panel pending. Consider coronary calcium CT.

## 2024-02-19 NOTE — Assessment & Plan Note (Signed)
 Repeat A1c pending

## 2024-02-19 NOTE — Progress Notes (Signed)
 Subjective:    Patient ID: Bobetta KATHEE Ada, female    DOB: Sep 11, 1980, 43 y.o.   MRN: [id number]  RECHELLE NIEBLA is a very pleasant 43 y.o. female who presents today for complete physical and follow up of chronic conditions.  Immunizations: -Tetanus: Completed in 2016 -Influenza: Influenza vaccine provided today. -HPV: Completed series   Diet: Fair diet.  Exercise: Regular exercise.  Eye exam: Completes annually  Dental exam: Completes semi-annually    Pap Smear: Completes per GYN Mammogram: Completed in July 2025       Review of Systems  Constitutional:  Negative for unexpected weight change.  HENT:  Negative for rhinorrhea.   Respiratory:  Negative for cough and shortness of breath.   Cardiovascular:  Negative for chest pain.  Gastrointestinal:  Negative for constipation and diarrhea.  Genitourinary:  Positive for menstrual problem. Negative for difficulty urinating.       Heavy menstrual cycles  Musculoskeletal:  Negative for arthralgias and myalgias.  Skin:  Negative for rash.  Allergic/Immunologic: Negative for environmental allergies.  Neurological:  Negative for dizziness, numbness and headaches.  Hematological:        Ice cravings  Psychiatric/Behavioral:  The patient is not nervous/anxious.          Past Medical History:  Diagnosis Date   Benign tumor of parathyroid gland    Cervical strain 05/12/2018   Complication of anesthesia    epidural in labor was ineffective   Fibroids    GERD (gastroesophageal reflux disease)    Gestational diabetes 2016   diet controlled   History of chicken pox    Umbilical hernia without obstruction and without gangrene 05/14/2019    Social History   Socioeconomic History   Marital status: Married    Spouse name: Caron   Number of children: 2   Years of education: Bachelors degree   Highest education level: Not on file  Occupational History   Not on file  Tobacco Use   Smoking status: Never   Smokeless  tobacco: Never  Vaping Use   Vaping status: Never Used  Substance and Sexual Activity   Alcohol use: Yes    Alcohol/week: 3.0 standard drinks of alcohol    Types: 3 Shots of liquor per week    Comment: once a week   Drug use: No   Sexual activity: Yes    Birth control/protection: Surgical    Comment: husband had vasectomy done  Other Topics Concern   Not on file  Social History Narrative   Lives with Husband Caron   2 children - Jinnie and Campbell   1 stepson - Curator (15)   Enjoys: reading, spending time with family, shopping, travelling    Exercise: video work-out - tries to do this once a week, needs to do better   Diet: tries to eat healthy   Social Drivers of Health   Financial Resource Strain: Low Risk  (05/12/2018)   Overall Financial Resource Strain (CARDIA)    Difficulty of Paying Living Expenses: Not hard at all  Food Insecurity: Not on file  Transportation Needs: Not on file  Physical Activity: Not on file  Stress: Not on file  Social Connections: Not on file  Intimate Partner Violence: Not on file    Past Surgical History:  Procedure Laterality Date   CESAREAN SECTION  05/01/2011   Procedure: CESAREAN SECTION;  Surgeon: Charlie CHRISTELLA Croak;  Location: [facility];  Service: Gynecology;  Laterality: N/A;  Primary cesarean section with delivery  of baby boy at 100. Apgars 9/9.   CESAREAN SECTION N/A 10/26/2014   Procedure: Repeat CESAREAN SECTION;  Surgeon: Dickie Carder, MD;  Location: [facility];  Service: Obstetrics;  Laterality: N/A;  EDD: 11/01/14   DILATION AND CURETTAGE OF UTERUS     DILATION AND CURETTAGE OF UTERUS  09/2008   WISDOM TOOTH EXTRACTION      Family History  Problem Relation Age of Onset   Mental illness Mother        anxiety   Nephrolithiasis Mother    Diabetes Mother    Hypertension Mother    Hearing loss Mother    Hyperlipidemia Mother    Diabetes Father    Hyperlipidemia Father    Hypertension Father    Cancer Maternal Grandmother 57        Pancreatic   Diabetes Brother     No Known Allergies  Current Outpatient Medications on File Prior to Visit  Medication Sig Dispense Refill   naproxen sodium (ALEVE) 220 MG tablet Take 220 mg by mouth daily as needed.     Pediatric Multiple Vit-C-FA (FLINSTONES GUMMIES OMEGA-3 DHA PO) Take 1 tablet by mouth daily.     No current facility-administered medications on file prior to visit.    BP 110/64   Pulse 77   Temp 97.8 F (36.6 C) (Temporal)   Ht 5' 3 (1.6 m)   Wt 182 lb (82.6 kg)   LMP 02/05/2024   SpO2 97%   Breastfeeding No   BMI 32.24 kg/m  Objective:   Physical Exam HENT:     Right Ear: Tympanic membrane and ear canal normal.     Left Ear: Tympanic membrane and ear canal normal.  Eyes:     Pupils: Pupils are equal, round, and reactive to light.  Cardiovascular:     Rate and Rhythm: Normal rate and regular rhythm.  Pulmonary:     Effort: Pulmonary effort is normal.     Breath sounds: Normal breath sounds.  Abdominal:     General: Bowel sounds are normal.     Palpations: Abdomen is soft.     Tenderness: There is no abdominal tenderness.  Musculoskeletal:        General: Normal range of motion.     Cervical back: Neck supple.  Skin:    General: Skin is warm and dry.  Neurological:     Mental Status: She is alert and oriented to person, place, and time.     Cranial Nerves: No cranial nerve deficit.     Deep Tendon Reflexes:     Reflex Scores:      Patellar reflexes are 2+ on the right side and 2+ on the left side. Psychiatric:        Mood and Affect: Mood normal.     Physical Exam        Assessment & Plan:  Preventative health care Assessment & Plan: Immunizations UTD. Influenza vaccine provided today.  Pap smear UTD. Follows with GYN Mammogram UTD  Discussed the importance of a healthy diet and regular exercise in order for weight loss, and to reduce the risk of further co-morbidity.  Exam stable. Labs pending.  Follow up in 1 year  for repeat physical.    Hyperprolactinemia (HCC) Assessment & Plan: Stable over the years.   Repeat prolactin levels pending.  Reviewed MRI brain from 2020  Orders: -     Prolactin  Generalized anxiety disorder Assessment & Plan: Controlled.  No concerns today.  Continue to  monitor.    Iron deficiency anemia, unspecified iron deficiency anemia type Assessment & Plan: Continued ice cravings.  IBC panel pending. Following with GYN for fibroids.   Orders: -     IBC + Ferritin -     CBC  High insulin -like growth factor 1 (IGF-1) level Assessment & Plan: Stable over the years. Repeat levels pending.  Orders: -     Insulin -like growth factor  Prediabetes Assessment & Plan: Repeat A1c pending  Orders: -     Hemoglobin A1c  Hyperlipidemia, unspecified hyperlipidemia type Assessment & Plan: Strong family history.  Repeat lipid panel pending. Consider coronary calcium CT.  Orders: -     Comprehensive metabolic panel with GFR -     Lipid panel    Assessment and Plan Assessment & Plan         Comer MARLA Gaskins, NP   History of Present Illness

## 2024-02-19 NOTE — Assessment & Plan Note (Signed)
 Stable over the years.   Repeat prolactin levels pending.  Reviewed MRI brain from 2020

## 2024-02-19 NOTE — Assessment & Plan Note (Signed)
 Stable over the years. Repeat levels pending.

## 2024-02-19 NOTE — Patient Instructions (Signed)
 Stop by the lab prior to leaving today. I will notify you of your results once received.   It was a pleasure to see you today!

## 2024-02-19 NOTE — Assessment & Plan Note (Signed)
 Controlled.  No concerns today. Continue to monitor.

## 2024-02-20 ENCOUNTER — Ambulatory Visit: Payer: Self-pay | Admitting: Primary Care

## 2024-02-23 LAB — INSULIN-LIKE GROWTH FACTOR
IGF-I, LC/MS: 219 ng/mL (ref 52–328)
Z-Score (Female): 1 {STDV} (ref ?–2.0)

## 2024-02-23 LAB — PROLACTIN: Prolactin: 23 ng/mL

## 2024-06-10 ENCOUNTER — Ambulatory Visit: Payer: Self-pay

## 2024-06-10 NOTE — Telephone Encounter (Signed)
 FYI Only or Action Required?: FYI only for provider: appointment scheduled on 06/11/24.  Patient was last seen in primary care on 02/19/2024 by Gretta Comer POUR, NP.  Called Nurse Triage reporting Allergic Reaction.  Symptoms began several days ago.  Interventions attempted: OTC medications: benadryl ; ponds facial cream and Rest, hydration, or home remedies.  Symptoms are: unchanged.  Triage Disposition: See PCP When Office is Open (Within 3 Days)  Patient/caregiver understands and will follow disposition?: Yes     Copied from CRM [id number]. Topic: Clinical - Red Word Triage >> Jun 10, 2024  9:58 AM Shanda MATSU wrote: Red Word that prompted transfer to Nurse Triage: Patient is reporting some type of allergic reaction that occurred on Sunday but seems to be getting worse due to face starting to swell today, patient reported itching and burning as well that started on Sunday.   Reason for Disposition  [1] MILD face swelling (e.g., puffiness) AND [2] persists > 3 days  Answer Assessment - Initial Assessment Questions Pt called in to report facial puffiness and burning after using her son's Neutrogena facial exfoliating cleanser and moisturizer. Pt states she normally does not use this product as she does have sensitive skin and immediately started to experience burning. Pt states she has tried a baby benadryl , ponds dry skin facial cream and applying milk to aid with relief but face is still puffy. Pt denies any swelling of lips, no changes in breathing. Discussed not to apply any further cosmetic products on face as her skin is irritated. Discussed benefits of moisturizing creams but to ensure first ingredient is water and not alcohol. Discussed antihistamines for itching and symptom control. Pt voiced appreciation. Pt scheduled for 06/11/24 but requested appt today if one came available.     1. ONSET: When did the swelling start? (e.g., minutes, hours, days)     Since Sunday   2.  LOCATION: What part of the face is swollen? (e.g., cheek, entire face, jaw joint area, under jaw)     Entire face   3. SEVERITY: How swollen is it?     Puffy   4. ITCHING: Is there any itching? If Yes, ask: How much?   (Scale 1-10; mild, moderate or severe)     Yes; burning itch   5. PAIN: Is the swelling painful to touch? If Yes, ask: How painful is it?   (Scale 0-10; mild, moderate or severe)     No   6. FEVER: Do you have a fever? If Yes, ask: What is it, how was it measured, and when did it start?      No   7. CAUSE: What do you think is causing the face swelling?     Neutrogena exfoliating cleanser   8. NEW MEDICINES: Have there been any new medicines started recently?     No   9. RECURRENT SYMPTOM: Have you had face swelling before? If Yes, ask: When was the last time? What happened that time?     No   10. OTHER SYMPTOMS: Do you have any other symptoms? (e.g., leg swelling, toothache)       No  Protocols used: Face Swelling-A-AH

## 2024-06-11 ENCOUNTER — Ambulatory Visit: Admitting: Primary Care

## 2024-06-11 ENCOUNTER — Encounter: Payer: Self-pay | Admitting: Primary Care

## 2024-06-11 VITALS — BP 118/64 | HR 77 | Temp 97.9°F | Ht 63.0 in | Wt 192.0 lb

## 2024-06-11 DIAGNOSIS — R220 Localized swelling, mass and lump, head: Secondary | ICD-10-CM | POA: Diagnosis not present

## 2024-06-11 MED ORDER — PREDNISONE 20 MG PO TABS: ORAL_TABLET | ORAL | 0 refills | Status: AC

## 2024-06-11 MED ORDER — METHYLPREDNISOLONE ACETATE 80 MG/ML IJ SUSP
80.0000 mg | Freq: Once | INTRAMUSCULAR | Status: AC
  Administered 2024-06-11: 80 mg via INTRAMUSCULAR

## 2024-06-11 NOTE — Assessment & Plan Note (Signed)
 Secondary to allergic reaction from topical medicated product. Fortunately, no alarm signs  She will remain off of the product.  Depo-Medrol  80 mg provided intramuscularly today. Start prednisone  tablets. Take two tablets my mouth once daily in the morning for four days, then one tablet once daily in the morning for four days.   We will also check food allergy  panel and alpha gal panel.  Await results.

## 2024-06-11 NOTE — Patient Instructions (Signed)
 Stop by the lab prior to leaving today. I will notify you of your results once received.   Start prednisone  tablets. Take two tablets my mouth once daily in the morning for four days, then one tablet once daily in the morning for four days.  Start the Claritin daily   It was a pleasure to see you today!

## 2024-06-11 NOTE — Progress Notes (Signed)
 "  Subjective:    Patient ID: Nicole Clark, female    DOB: April 23, 1981, 44 y.o.   MRN: [id number]  Nicole Clark is a very pleasant 44 y.o. female with a history of pituitary adenoma, hyperprolactinemia, iron deficiency anemia, prediabetes, hyperlipidemia who presents today to discuss facial swelling.  Symptom onset 5 days ago with facial swelling and itching to her cheeks and forehead. Prior to symptom onset she used an old Ronal Murray sunscreen/primer, she is not sure if this was the cause.  Since then she's continued to notice facial swelling and itching, now to the eyes. She's been taking Benadryl , applying Ponds facial pads, application of milk without improvement.   She denies wheezing, throat tightness, shortness of breath, swelling around her oral cavity and lips. She's not applied anything else to her face.  She purchased some Claritin yesterday but has yet to take it.  Denies known food allergies or any other allergies throughout her life.  She has never experienced an allergic reaction like this previously.   Review of Systems  Constitutional:  Negative for fever.  HENT:  Positive for facial swelling. Negative for trouble swallowing.   Respiratory:  Negative for shortness of breath and wheezing.   Skin:  Negative for rash.         Past Medical History:  Diagnosis Date   Benign tumor of parathyroid gland    Cervical strain 05/12/2018   Complication of anesthesia    epidural in labor was ineffective   Fibroids    GERD (gastroesophageal reflux disease)    Gestational diabetes 2016   diet controlled   History of chicken pox    Umbilical hernia without obstruction and without gangrene 05/14/2019    Social History   Socioeconomic History   Marital status: Married    Spouse name: Caron   Number of children: 2   Years of education: Bachelors degree   Highest education level: Not on file  Occupational History   Not on file  Tobacco Use   Smoking status: Never    Smokeless tobacco: Never  Vaping Use   Vaping status: Never Used  Substance and Sexual Activity   Alcohol use: Yes    Alcohol/week: 3.0 standard drinks of alcohol    Types: 3 Shots of liquor per week    Comment: once a week   Drug use: No   Sexual activity: Yes    Birth control/protection: Surgical    Comment: husband had vasectomy done  Other Topics Concern   Not on file  Social History Narrative   Lives with Husband Caron   2 children - Jinnie and Vinita   1 stepson - Curator (15)   Enjoys: reading, spending time with family, shopping, travelling    Exercise: video work-out - tries to do this once a week, needs to do better   Diet: tries to eat healthy   Social Drivers of Health   Tobacco Use: Low Risk (06/11/2024)   Patient History    Smoking Tobacco Use: Never    Smokeless Tobacco Use: Never    Passive Exposure: Not on file  Financial Resource Strain: Not on file  Food Insecurity: Not on file  Transportation Needs: Not on file  Physical Activity: Not on file  Stress: Not on file  Social Connections: Not on file  Intimate Partner Violence: Not on file  Depression (PHQ2-9): Low Risk (06/11/2024)   Depression (PHQ2-9)    PHQ-2 Score: 0  Alcohol Screen: Not on file  Housing: Not on file  Utilities: Not on file  Health Literacy: Not on file    Past Surgical History:  Procedure Laterality Date   CESAREAN SECTION  05/01/2011   Procedure: CESAREAN SECTION;  Surgeon: Charlie CHRISTELLA Croak;  Location: [facility];  Service: Gynecology;  Laterality: N/A;  Primary cesarean section with delivery of baby boy at 78. Apgars 9/9.   CESAREAN SECTION N/A 10/26/2014   Procedure: Repeat CESAREAN SECTION;  Surgeon: Dickie Carder, MD;  Location: [facility];  Service: Obstetrics;  Laterality: N/A;  EDD: 11/01/14   DILATION AND CURETTAGE OF UTERUS     DILATION AND CURETTAGE OF UTERUS  09/2008   WISDOM TOOTH EXTRACTION      Family History  Problem Relation Age of Onset   Mental illness Mother         anxiety   Nephrolithiasis Mother    Diabetes Mother    Hypertension Mother    Hearing loss Mother    Hyperlipidemia Mother    Diabetes Father    Hyperlipidemia Father    Hypertension Father    Cancer Maternal Grandmother 50       Pancreatic   Diabetes Brother     Allergies[1]  Medications Ordered Prior to Encounter[2]  BP 118/64   Pulse 77   Temp 97.9 F (36.6 C) (Oral)   Ht 5' 3 (1.6 m)   Wt 192 lb (87.1 kg) Comment: Wearing work gear  LMP 05/22/2024   SpO2 98%   BMI 34.01 kg/m  Objective:   Physical Exam HENT:     Head: Normocephalic.     Mouth/Throat:     Comments: Mild facial swelling to cheeks bilaterally, both eyelids and surrounding eyes.  Dry scaly skin to right side near right eye.  No erythema. Cardiovascular:     Rate and Rhythm: Normal rate and regular rhythm.  Pulmonary:     Effort: Pulmonary effort is normal.     Breath sounds: Normal breath sounds. No wheezing.  Skin:    Findings: No erythema.  Neurological:     Mental Status: She is alert.     Physical Exam        Assessment & Plan:  Facial swelling Assessment & Plan: Secondary to allergic reaction from topical medicated product. Fortunately, no alarm signs  She will remain off of the product.  Depo-Medrol  80 mg provided intramuscularly today. Start prednisone  tablets. Take two tablets my mouth once daily in the morning for four days, then one tablet once daily in the morning for four days.   We will also check food allergy  panel and alpha gal panel.  Await results.  Orders: -     predniSONE ; Take two tablets my mouth once daily in the morning for four days, then one tablet once daily in the morning for four days.  Dispense: 12 tablet; Refill: 0 -     Food Allergy  Profile -     Alpha-Gal Panel    Assessment and Plan Assessment & Plan         Comer MARLA Gaskins, NP       [1] No Known Allergies [2]  Current Outpatient Medications on File Prior to Visit   Medication Sig Dispense Refill   naproxen sodium (ALEVE) 220 MG tablet Take 220 mg by mouth daily as needed.     No current facility-administered medications on file prior to visit.   "

## 2024-06-16 LAB — FOOD ALLERGY PROFILE
"Macadamia Nut ": <0.1 kU/L
"Sesame Seed f10 ": <0.1 kU/L
Allergen, Salmon, f41: <0.1 kU/L
Almonds: <0.1 kU/L
Brazil Nut: <0.1 kU/L
CLASS: 0
CLASS: 0
CLASS: 0
CLASS: 0
CLASS: 0
CLASS: 0
CLASS: 0
CLASS: 0
CLASS: 0
CLASS: 0
CLASS: 0
CLASS: 0
Cashew IgE: <0.1 kU/L
Class: 0
Class: 0
Class: 0
Class: 0
Class: 0
Egg White IgE: <0.1 kU/L
Fish Cod: <0.1 kU/L
Hazelnut: <0.1 kU/L
Milk IgE: <0.1 kU/L
Peanut IgE: <0.1 kU/L
Scallop IgE: <0.1 kU/L
Shrimp IgE: <0.1 kU/L
Soybean IgE: <0.1 kU/L
Tuna IgE: <0.1 kU/L
Walnut: <0.1 kU/L
Wheat IgE: <0.1 kU/L

## 2024-06-16 LAB — ALPHA-GAL PANEL
Allergen, Mutton, f88: <0.1 kU/L
Allergen, Pork, f26: <0.1 kU/L
Beef: <0.1 kU/L
CLASS: 0
CLASS: 0
Class: 0
GALACTOSE-ALPHA-1,3-GALACTOSE IGE*: <0.1 kU/L

## 2024-06-16 LAB — INTERPRETATION:

## 2024-06-17 ENCOUNTER — Ambulatory Visit: Payer: Self-pay | Admitting: Primary Care

## 2025-02-19 ENCOUNTER — Encounter: Admitting: Primary Care
